# Patient Record
Sex: Female | Born: 1965 | Race: White | Hispanic: No | Marital: Married | Smoking: Never smoker
Health system: Southern US, Community
[De-identification: ages and names within clinical notes are randomized; demographics above are authoritative.]

## PROBLEM LIST (undated history)

## (undated) DIAGNOSIS — B019 Varicella without complication: Secondary | ICD-10-CM

## (undated) DIAGNOSIS — Z9189 Other specified personal risk factors, not elsewhere classified: Secondary | ICD-10-CM

## (undated) DIAGNOSIS — F329 Major depressive disorder, single episode, unspecified: Secondary | ICD-10-CM

## (undated) DIAGNOSIS — E039 Hypothyroidism, unspecified: Secondary | ICD-10-CM

## (undated) DIAGNOSIS — F32A Depression, unspecified: Secondary | ICD-10-CM

## (undated) HISTORY — DX: Varicella without complication: B01.9

## (undated) HISTORY — DX: Major depressive disorder, single episode, unspecified: F32.9

## (undated) HISTORY — DX: Other specified personal risk factors, not elsewhere classified: Z91.89

## (undated) HISTORY — DX: Depression, unspecified: F32.A

---

## 2004-11-26 HISTORY — PX: TUBAL LIGATION: SHX77

## 2014-10-02 ENCOUNTER — Encounter (HOSPITAL_COMMUNITY): Payer: Self-pay | Admitting: Nurse Practitioner

## 2014-10-02 ENCOUNTER — Emergency Department (HOSPITAL_COMMUNITY): Payer: 59

## 2014-10-02 ENCOUNTER — Emergency Department (HOSPITAL_COMMUNITY)
Admission: EM | Admit: 2014-10-02 | Discharge: 2014-10-02 | Disposition: A | Discharge status: Home / Self Care | Payer: 59 | Attending: Emergency Medicine | Admitting: Emergency Medicine

## 2014-10-02 DIAGNOSIS — R079 Chest pain, unspecified: Secondary | ICD-10-CM | POA: Insufficient documentation

## 2014-10-02 DIAGNOSIS — R42 Dizziness and giddiness: Secondary | ICD-10-CM | POA: Insufficient documentation

## 2014-10-02 DIAGNOSIS — R55 Syncope and collapse: Secondary | ICD-10-CM | POA: Insufficient documentation

## 2014-10-02 DIAGNOSIS — R112 Nausea with vomiting, unspecified: Secondary | ICD-10-CM | POA: Insufficient documentation

## 2014-10-02 DIAGNOSIS — Z8639 Personal history of other endocrine, nutritional and metabolic disease: Secondary | ICD-10-CM | POA: Insufficient documentation

## 2014-10-02 DIAGNOSIS — R0602 Shortness of breath: Secondary | ICD-10-CM | POA: Diagnosis not present

## 2014-10-02 HISTORY — DX: Hypothyroidism, unspecified: E03.9

## 2014-10-02 LAB — CBC
HEMATOCRIT: 35.6 % — AB (ref 36.0–46.0)
Hemoglobin: 12 g/dL (ref 12.0–15.0)
MCH: 30.3 pg (ref 26.0–34.0)
MCHC: 33.7 g/dL (ref 30.0–36.0)
MCV: 89.9 fL (ref 78.0–100.0)
PLATELETS: 395 10*3/uL (ref 150–400)
RBC: 3.96 MIL/uL (ref 3.87–5.11)
RDW: 12.5 % (ref 11.5–15.5)
WBC: 5.9 10*3/uL (ref 4.0–10.5)

## 2014-10-02 LAB — URINALYSIS, ROUTINE W REFLEX MICROSCOPIC
BILIRUBIN URINE: NEGATIVE
GLUCOSE, UA: NEGATIVE mg/dL
Ketones, ur: NEGATIVE mg/dL
Leukocytes, UA: NEGATIVE
Nitrite: NEGATIVE
PH: 6.5 (ref 5.0–8.0)
Protein, ur: NEGATIVE mg/dL
Specific Gravity, Urine: 1.009 (ref 1.005–1.030)
Urobilinogen, UA: 0.2 mg/dL (ref 0.0–1.0)

## 2014-10-02 LAB — BASIC METABOLIC PANEL
Anion gap: 11 (ref 5–15)
BUN: 15 mg/dL (ref 6–23)
CHLORIDE: 97 meq/L (ref 96–112)
CO2: 28 mEq/L (ref 19–32)
CREATININE: 0.9 mg/dL (ref 0.50–1.10)
Calcium: 9.6 mg/dL (ref 8.4–10.5)
GFR, EST AFRICAN AMERICAN: 87 mL/min — AB (ref >90)
GFR, EST NON AFRICAN AMERICAN: 75 mL/min — AB (ref >90)
Glucose, Bld: 100 mg/dL — ABNORMAL HIGH (ref 70–99)
POTASSIUM: 4.1 meq/L (ref 3.7–5.3)
Sodium: 136 mEq/L — ABNORMAL LOW (ref 137–147)

## 2014-10-02 LAB — URINE MICROSCOPIC-ADD ON

## 2014-10-02 LAB — TSH: TSH: 3.5 u[IU]/mL (ref 0.350–4.500)

## 2014-10-02 LAB — I-STAT TROPONIN, ED: Troponin i, poc: 0 ng/mL (ref 0.00–0.08)

## 2014-10-02 MED ORDER — MECLIZINE HCL 25 MG PO TABS
25.0000 mg | ORAL_TABLET | Freq: Once | ORAL | Status: AC
  Administered 2014-10-02: 25 mg via ORAL
  Filled 2014-10-02: qty 1

## 2014-10-02 MED ORDER — DIAZEPAM 5 MG PO TABS
5.0000 mg | ORAL_TABLET | Freq: Once | ORAL | Status: AC
  Administered 2014-10-02: 5 mg via ORAL
  Filled 2014-10-02: qty 1

## 2014-10-02 MED ORDER — MECLIZINE HCL 25 MG PO TABS: 25.0000 mg | ORAL_TABLET | Freq: Three times a day (TID) | ORAL | Status: DC | PRN

## 2014-10-02 MED ORDER — HYDROCODONE-ACETAMINOPHEN 5-325 MG PO TABS: 1.0000 | ORAL_TABLET | Freq: Four times a day (QID) | ORAL | Status: DC | PRN

## 2014-10-02 MED ORDER — IBUPROFEN 800 MG PO TABS: 800.0000 mg | ORAL_TABLET | Freq: Three times a day (TID) | ORAL | Status: AC

## 2014-10-02 MED ORDER — DIAZEPAM 5 MG PO TABS: 5.0000 mg | ORAL_TABLET | Freq: Three times a day (TID) | ORAL | Status: DC | PRN

## 2014-10-02 NOTE — Discharge Instructions (Signed)
Benign Positional Vertigo °Vertigo means you feel like you or your surroundings are moving when they are not. Benign positional vertigo is the most common form of vertigo. Benign means that the cause of your condition is not serious. Benign positional vertigo is more common in older adults. °CAUSES  °Benign positional vertigo is the result of an upset in the labyrinth system. This is an area in the middle ear that helps control your balance. This may be caused by a viral infection, head injury, or repetitive motion. However, often no specific cause is found. °SYMPTOMS  °Symptoms of benign positional vertigo occur when you move your head or eyes in different directions. Some of the symptoms may include: °· Loss of balance and falls. °· Vomiting. °· Blurred vision. °· Dizziness. °· Nausea. °· Involuntary eye movements (nystagmus). °DIAGNOSIS  °Benign positional vertigo is usually diagnosed by physical exam. If the specific cause of your benign positional vertigo is unknown, your caregiver may perform imaging tests, such as magnetic resonance imaging (MRI) or computed tomography (CT). °TREATMENT  °Your caregiver may recommend movements or procedures to correct the benign positional vertigo. Medicines such as meclizine, benzodiazepines, and medicines for nausea may be used to treat your symptoms. In rare cases, if your symptoms are caused by certain conditions that affect the inner ear, you may need surgery. °HOME CARE INSTRUCTIONS  °· Follow your caregiver's instructions. °· Move slowly. Do not make sudden body or head movements. °· Avoid driving. °· Avoid operating heavy machinery. °· Avoid performing any tasks that would be dangerous to you or others during a vertigo episode. °· Drink enough fluids to keep your urine clear or pale yellow. °SEEK IMMEDIATE MEDICAL CARE IF:  °· You develop problems with walking, weakness, numbness, or using your arms, hands, or legs. °· You have difficulty speaking. °· You develop  severe headaches. °· Your nausea or vomiting continues or gets worse. °· You develop visual changes. °· Your family or friends notice any behavioral changes. °· Your condition gets worse. °· You have a fever. °· You develop a stiff neck or sensitivity to light. °MAKE SURE YOU:  °· Understand these instructions. °· Will watch your condition. °· Will get help right away if you are not doing well or get worse. °Document Released: 08/20/2006 Document Revised: 02/04/2012 Document Reviewed: 08/02/2011 °ExitCare® Patient Information ©2015 ExitCare, LLC. This information is not intended to replace advice given to you by your health care provider. Make sure you discuss any questions you have with your health care provider. ° °Dizziness °Dizziness is a common problem. It is a feeling of unsteadiness or light-headedness. You may feel like you are about to faint. Dizziness can lead to injury if you stumble or fall. A person of any age group can suffer from dizziness, but dizziness is more common in older adults. °CAUSES  °Dizziness can be caused by many different things, including: °· Middle ear problems. °· Standing for too long. °· Infections. °· An allergic reaction. °· Aging. °· An emotional response to something, such as the sight of blood. °· Side effects of medicines. °· Tiredness. °· Problems with circulation or blood pressure. °· Excessive use of alcohol or medicines, or illegal drug use. °· Breathing too fast (hyperventilation). °· An irregular heart rhythm (arrhythmia). °· A low red blood cell count (anemia). °· Pregnancy. °· Vomiting, diarrhea, fever, or other illnesses that cause body fluid loss (dehydration). °· Diseases or conditions such as Parkinson's disease, high blood pressure (hypertension), diabetes, and thyroid problems. °·   Exposure to extreme heat. DIAGNOSIS  Your health care provider will ask about your symptoms, perform a physical exam, and perform an electrocardiogram (ECG) to record the electrical  activity of your heart. Your health care provider may also perform other heart or blood tests to determine the cause of your dizziness. These may include:  Transthoracic echocardiogram (TTE). During echocardiography, sound waves are used to evaluate how blood flows through your heart.  Transesophageal echocardiogram (TEE).  Cardiac monitoring. This allows your health care provider to monitor your heart rate and rhythm in real time.  Holter monitor. This is a portable device that records your heartbeat and can help diagnose heart arrhythmias. It allows your health care provider to track your heart activity for several days if needed.  Stress tests by exercise or by giving medicine that makes the heart beat faster. TREATMENT  Treatment of dizziness depends on the cause of your symptoms and can vary greatly. HOME CARE INSTRUCTIONS   Drink enough fluids to keep your urine clear or pale yellow. This is especially important in very hot weather. In older adults, it is also important in cold weather.  Take your medicine exactly as directed if your dizziness is caused by medicines. When taking blood pressure medicines, it is especially important to get up slowly.  Rise slowly from chairs and steady yourself until you feel okay.  In the morning, first sit up on the side of the bed. When you feel okay, stand slowly while holding onto something until you know your balance is fine.  Move your legs often if you need to stand in one place for a long time. Tighten and relax your muscles in your legs while standing.  Have someone stay with you for 1-2 days if dizziness continues to be a problem. Do this until you feel you are well enough to stay alone. Have the person call your health care provider if he or she notices changes in you that are concerning.  Do not drive or use heavy machinery if you feel dizzy.  Do not drink alcohol. SEEK IMMEDIATE MEDICAL CARE IF:   Your dizziness or light-headedness  gets worse.  You feel nauseous or vomit.  You have problems talking, walking, or using your arms, hands, or legs.  You feel weak.  You are not thinking clearly or you have trouble forming sentences. It may take a friend or family member to notice this.  You have chest pain, abdominal pain, shortness of breath, or sweating.  Your vision changes.  You notice any bleeding.  You have side effects from medicine that seems to be getting worse rather than better. MAKE SURE YOU:   Understand these instructions.  Will watch your condition.  Will get help right away if you are not doing well or get worse. Document Released: 05/08/2001 Document Revised: 11/17/2013 Document Reviewed: 06/01/2011 [facility] Patient Information 2015 [place], [place]. This information is not intended to replace advice given to you by your health care provider. Make sure you discuss any questions you have with your health care provider.   Emergency Department Resource Guide 1) Find a Doctor and Pay Out of Pocket Although you won't have to find out who is covered by your insurance plan, it is a good idea to ask around and get recommendations. You will then need to call the office and see if the doctor you have chosen will accept you as a new patient and what types of options they offer for patients who are self-pay. Some  doctors offer discounts or will set up payment plans for their patients who do not have insurance, but you will need to ask so you aren't surprised when you get to your appointment.  2) Contact Your Local Health Department Not all health departments have doctors that can see patients for sick visits, but many do, so it is worth a call to see if yours does. If you don't know where your local health department is, you can check in your phone book. The CDC also has a tool to help you locate your state's health department, and many state websites also have listings of all of their local health  departments.  3) Find a Walk-in Clinic If your illness is not likely to be very severe or complicated, you may want to try a walk in clinic. These are popping up all over the country in pharmacies, drugstores, and shopping centers. They're usually staffed by nurse practitioners or physician assistants that have been trained to treat common illnesses and complaints. They're usually fairly quick and inexpensive. However, if you have serious medical issues or chronic medical problems, these are probably not your best option.  No Primary Care Doctor: - Call Health Connect at  [phone] - they can help you locate a primary care doctor that  accepts your insurance, provides certain services, etc. - Physician Referral Service- [phone]  Chronic Pain Problems: Organization         Address  Phone   Notes  [facility]  [phone] Patients need to be referred by their primary care doctor.   Medication Assistance: Organization         Address  Phone   Notes  [facility] [address] [place], [place] [address] [phone] --Must be a resident of [place] -- Must have NO insurance coverage whatsoever (no Medicaid/ Medicare, etc.) -- The pt. MUST have a primary care doctor that directs their care regularly and follows them in the community   Med[facility]ist  [phone]) [phone]   [employer]  [phone]) [phone]    Agencies that provide inexpensive medical care: Organization         Address  Phone   Notes  [facility]  [phone]) [phone]   [facility]    [phone]) [phone]   [facility] [address] [place], [place] [address] [phone]) [phone]   [facility] [address], [place] [phone]   [facility]anned Parenthood    [phone]) [phone]   [facility]    [phone]   [facility]  [address], [place] Phone:  [phone])  [phone], Fax:  [phone] Hours of Operation:  9 am - 6 pm, M-F.  Also accepts Medicaid/Medicare and self-pay.  [facility]  [address], [place] Phone: [phone], Fax: [phone]) [phone]. Hours of Operation:  8:30 am - 5:30 pm, M-F.  Also accepts Medicaid and self-pay.  [facility] [place] [address], [place] Phone: [phone]) [phone]   [facility] [address], [place], [place] [phone], Ext. [phone] Mondays & Thursdays: 7-9 AM.  First 15 patients are seen on a first come, first serve basis.    Medicaid-accepting [facility] Providers:  Organization         Address  Phone   Notes  [facility] [address], [place] [phone] Also accepts self-pay patients.  [facility] [id number] [place], [place]  [phone]   [place], [address], [place] [phone]   [facility] [address], [place] [phone]   Lucianne Lei [address], [place]   [phone] Only accepts [place] Access [place] patients after they have their name applied to their card.   Self-Pay (no insurance) in [facility]:  Organization         Address  Phone   Notes  Sickle Cell Patients, [facility] [employer] [phone]   [facility] Urgent Care [place] [phone]   [facility]  [place], [place], [place] [phone]   [facility]/Dr. Osei-Bonsu  [address] [place] or [address], [place] [phone] Phone number for both [place] and [place] locations is the same.  [facility] [address], [place] [phone]   [facility] [address] [place] or [address] [phone]   [facility] [address] [place] [phone], phone; [phone], fax Sees patients 1st and 3rd Saturday of every month.  Must not qualify for public or private insurance (i.e. Medicaid, Medicare, [place] Health Choice, Veterans' Benefits)  Household income should be no more than 200% of the poverty level The clinic cannot treat you if you are pregnant or think you are pregnant  Sexually transmitted diseases are not treated at the clinic.    Dental Care: Organization         Address  Phone  Notes  [facility] Department of [facility] [place] [phone] Accepts children up to age 58 who are enrolled in [place] or [place]; pregnant women with a Medicaid card; and children who have applied for Medicaid or [place] Health Choice, but were declined, whose parents can pay a reduced fee at time of service.  [facility] of [facility]  [address], [place] [phone] Accepts children up to age 82 who are enrolled in [place] or [place]; pregnant women with a Medicaid card; and children who have applied for Medicaid or [place] Health Choice, but were declined, whose parents can pay a reduced fee at time of service.  [facility] Access PRO[facility]  [place] [phone] Patients are seen by appointment only. Walk-ins are not accepted. [place] will see patients 84 years of age and older. Monday - Tuesday (8am-5pm) Most Wednesdays (8:30-5pm) $30 per visit, cash only  [facility] Adult Dental Access PROGRAM  [address], [facility] [phone] Patients are seen by appointment only. Walk-ins are not accepted. [place] will see patients 36 years of age and older. One Wednesday Evening (Monthly: Volunteer Based).  $30 per visit, cash only  [place]  [phone] for adults;  Children under age 18, call Graduate Pediatric Dentistry at [phone]. Children aged 53-14, please call [phone] to request a pediatric application.  Dental services are provided in all areas of dental care including fillings, crowns and bridges, complete and partial dentures, implants, gum treatment, root canals, and extractions. Preventive  care is also provided. Treatment is provided to both adults and children. Patients are selected via a lottery and there is often a waiting list.   [facility] [address] [place]  [phone] [email or web address]   [facility] [address] [place], [place] [phone], Ext. [phone] Second and Fourth Thursday of each month, opens at 6:30 AM; Clinic ends at 9 AM.  Patients are seen on a first-come first-served basis, and a limited number are seen during each clinic.   [facility]  [address] Hillard Danker [place], [place] [phone]   Eligibility Requirements You must have lived in [place], [place], or [place] counties for at least the last three months.   You cannot be eligible for state or federal sponsored [employer], including [employer], [place], or [employer].   You generally cannot be eligible for healthcare insurance through your employer.    How to apply: Eligibility screenings are held every Tuesday and Wednesday afternoon from 1:00 pm until 4:00 pm. You do not need an appointment for the interview!  [facility] [address] [place], [place]   [place]  [place] Department  [place]  [phone]    Behavioral Health Resources in the Community: Intensive Outpatient Programs Organization         Address  Phone  Notes  [facility] [place]. [address] [place], [place] [phone]   [facility]patient [address] [place], [place]   ADS: Alcohol [facility] Drug S[facility] [address] [place], [place]   [place] [address]  [place], [place] or [phone]   Substance Abuse Resources Organization         Address  Phone  Notes  Alcohol and Drug Services  7183960288   [place]  [phone]   [facility] [place]   Chinita Pester  [phone]   Residential & Outpatient Substance Abuse Program  [phone]   Psychological Services Organization         Address  Phone  Notes  [facility] [place]  [place]  [phone]   [place] [address] [place] or [phone]    Mobile Crisis Teams Organization         Address  Phone  Notes  [facility], Mobile Crisis Care Unit  [phone]   [facility]  [address] [place], [place]   [place] Levels [address] [place] [phone]    Self-Help/Support Groups Organization         Address  Phone             Notes  [place]. of [place] - variety of support groups  [place] Call for more information  Narcotics Anonymous (NA), [facility] [address], [employer] [place]  2 meetings at this location   Special educational needs teacher         Address  Phone  Notes  [facility] Residential Treatment [place],    [place]  [phone]   [facility]  [address] [place] [id number], [place], [place]   [place], [place] [phone] Admissions: 8am-3pm M-F  Incentives Substance [place] [address]  [place], [place]   [facility] [address] Jadene Pierini [place], [place]   The [place].,  [place], [place]   [facility] Programs - Intensive  Outpatient [address] Kristeen Mans [address], [place], [place]   [facility] ([place] [address]  [place], [place] [phone] or [phone]   [facility] Services (RTS) [address] [place], [place] Accepts Medicaid  [facility] [place] [address]  [place] [phone] Substance Abuse/Addiction Treatment   [facility] Organization         Address  Phone  Notes  [facility]  [phone]   Domenic Schwab, PhD [address] [place], [place]   [phone] or [phone]   [place], [place] [phone]   [facility] [address] [place], [place] [phone] Insurance/Medicaid/sponsorship through [facility] and Families [address] [place]                                    [place], [place] [phone] [place] [address], [place] [phone]    Dr. Adele Schilder  [phone]   [facility] Dept. 1) [address] [place] 2) [place] 3)  [address], [place] [phone]  [phone]   [place] [phone] or [phone] (After Hours)

## 2014-10-02 NOTE — ED Notes (Signed)
Pt states dizziness x3 months, was recently diagnosed with hypothyroidism and has been taking medications. Pt also states nausea/vomiting and generalized body aches. No neurological deficits noted. Able to move all extremities. Pt is alert and oriented x4. NAD.

## 2014-10-02 NOTE — ED Provider Notes (Signed)
CSN: [id number]     Arrival date & time 10/02/14  1407 History   First MD Initiated Contact with Patient 10/02/14 1458     Chief Complaint  Patient presents with  . Dizziness     (Consider location/radiation/quality/duration/timing/severity/associated sxs/prior Treatment) Patient is a 48 y.o. female presenting with dizziness. The history is provided by the patient.  Dizziness Quality:  Head spinning, lightheadedness and vertigo Severity:  Moderate Onset quality:  Gradual Timing:  Constant Progression:  Worsening Chronicity:  New Context: physical activity and standing up   Relieved by:  Being still Worsened by:  Nothing tried Associated symptoms: chest pain (sharp, central, nonradiating, with her dizzy spells, never by itself), nausea, shortness of breath (with her dizzy spells) and vomiting     Past Medical History  Diagnosis Date  . Hypothyroid    History reviewed. No pertinent past surgical history. History reviewed. No pertinent family history. History  Substance Use Topics  . Smoking status: Never Smoker   . Smokeless tobacco: Not on file  . Alcohol Use: Yes     Comment: social   OB History    No data available     Review of Systems  Constitutional: Negative for fever and chills.  Respiratory: Positive for shortness of breath (with her dizzy spells).   Cardiovascular: Positive for chest pain (sharp, central, nonradiating, with her dizzy spells, never by itself).  Gastrointestinal: Positive for nausea and vomiting.  Musculoskeletal: Positive for joint swelling (hip pains recently).  Neurological: Positive for dizziness and syncope (2 months ago).  All other systems reviewed and are negative.     Allergies  Review of patient's allergies indicates not on file.  Home Medications   Prior to Admission medications   Not on File   BP 162/97 mmHg  Pulse 57  Temp(Src) 97.3 F (36.3 C) (Oral)  Resp 20  Ht 5\' 7"  (1.702 m)  Wt 176 lb 9.6 oz (80.105 kg)  BMI  27.65 kg/m2  SpO2 100% Physical Exam  Constitutional: She is oriented to person, place, and time. She appears well-developed and well-nourished. No distress.  HENT:  Head: Normocephalic and atraumatic.  Mouth/Throat: Oropharynx is clear and moist.  Eyes: EOM are normal. Pupils are equal, round, and reactive to light.  Neck: Normal range of motion. Neck supple.  Cardiovascular: Normal rate and regular rhythm.  Exam reveals no friction rub.   No murmur heard. Pulmonary/Chest: Effort normal and breath sounds normal. No respiratory distress. She has no wheezes. She has no rales.  Abdominal: Soft. She exhibits no distension. There is no tenderness. There is no rebound.  Musculoskeletal: Normal range of motion. She exhibits no edema.  Neurological: She is alert and oriented to person, place, and time. No cranial nerve deficit. She exhibits normal muscle tone. Coordination normal.  No ataxia, however extremely dizzy when standing  Skin: No rash noted. She is not diaphoretic.  Nursing note and vitals reviewed.   ED Course  Procedures (including critical care time) Labs Review Labs Reviewed  CBC - Abnormal; Notable for the following:    HCT 35.6 (*)    All other components within normal limits  BASIC METABOLIC PANEL - Abnormal; Notable for the following:    Sodium 136 (*)    Glucose, Bld 100 (*)    GFR calc non Af Amer 75 (*)    GFR calc Af Amer 87 (*)    All other components within normal limits  URINALYSIS, ROUTINE W REFLEX MICROSCOPIC  I-STAT TROPOININ, ED  Imaging Review Dg Chest 2 View  10/02/2014   CLINICAL DATA:  Dizziness for last 2 months since being diagnosed with hypothyroidism.  EXAM: CHEST  2 VIEW  COMPARISON:  None.  FINDINGS: The heart size and mediastinal contours are within normal limits. Both lungs are clear. No pleural effusion or pneumothorax. The visualized skeletal structures are unremarkable.  IMPRESSION: Normal chest radiographs.   Electronically Signed   By:  Amie Portlandavid  Ormond M.D.   On: 10/02/2014 17:01   Dg Pelvis 1-2 Views  10/02/2014   CLINICAL DATA:  Severe bilateral hip pain for 2 months  EXAM: PELVIS - 1-2 VIEW  COMPARISON:  None.  FINDINGS: There is no evidence of pelvic fracture or diastasis. No pelvic bone lesions are seen.  IMPRESSION: Negative.   Electronically Signed   By: Christiana PellantGretchen  Green M.D.   On: 10/02/2014 17:01   Mr Brain Wo Contrast  10/02/2014   CLINICAL DATA:  Dizziness. Hypothyroidism. Nausea and vomiting. Generalized body aches.  EXAM: MRI HEAD WITHOUT CONTRAST  TECHNIQUE: Multiplanar, multiecho pulse sequences of the brain and surrounding structures were obtained without intravenous contrast.  COMPARISON:  None.  FINDINGS: No acute infarct, hemorrhage, or mass lesion is present. The ventricles are of normal size. No significant extraaxial fluid collection is present. No significant white matter disease is present.  Flow is present in the major intracranial arteries. The globes and orbits are intact. Midline structures are within normal limits. Mild mucosal thickening is present in the anterior ethmoid air cells and inferior right frontal sinus. Remaining paranasal sinuses and the mastoid air cells are clear.  IMPRESSION: 1. Normal MRI appearance of the brain. 2. Minimal anterior paranasal sinus disease.   Electronically Signed   By: Gennette Pachris  Mattern M.D.   On: 10/02/2014 16:48     EKG Interpretation   Date/Time:  Saturday October 02 2014 14:15:06 EST Ventricular Rate:  60 PR Interval:  166 QRS Duration: 94 QT Interval:  420 QTC Calculation: 420 R Axis:   19 Text Interpretation:  Normal sinus rhythm Low voltage QRS Cannot rule out  Anterior infarct , age undetermined Abnormal ECG Similar to prior  Confirmed by Gwendolyn GrantWALDEN  MD, Haziel Molner [phone]) on 10/02/2014 2:59:33 PM      MDM   Final diagnoses:  Dizziness  Vertigo    48 year old female presents with multiple complaints. She has been complaining of dizziness for 3 months. These  episodes of dizziness are described as "vertigo, lightheaded, and dizzy". She also has sharp central chest pain with these episodes and usually has nausea. She's had these for 2 months now. She passed on the shower 31 day 2 months ago and saw physician in [place], where she used to reside as of one year ago. She was diagnosed with hypothyroidism and started on levothyroxine. Patient has noticed increasing episodes of the dizziness, chest pain, nausea over the past 3 months and the dizziness has been constant for the past 3 days. Denies any fever, blurry vision, shortness of breath. She has no medical history and no family medical history. Here vitals are stable. Heart and lungs are clear. Belly is benign. Lasted the patient up she became dizzy. She is not ataxic, but cannot move because of dizziness became severe. TMs are clear. With her multiple descriptors of dizziness, hard to pinpoint etiology. EKG is normal. Will check orthostatics, basic labs, recent peak thyroid studies. Will get an MRI of her brain.   MRI, x-rays, labs normal. Feeling better with medicines, but not fully 100%.  Orthostatics negative. Informed her this could be vertigo but will need further workup. Given meclizine, Valium to go home for that. She is also complaining of right hip pain, could be bursitis as she's had no trauma and has normal x-rays. Given Motrin and mild amount of pain medicine. Given resource guide help establish follow-up. Stable for discharge.  Elwin MochaBlair Laura-Lee Villegas, MD 10/02/14 [phone]

## 2014-10-02 NOTE — ED Notes (Signed)
Pt went to [facility] today for 3 month history of intermittent dizziness. She was sent here for further evaluation. She was seen by her PCP in [place] and diagnosed with hypothyroidism on 10/19. She was started on medication treatment of hypothyroid at that time and initially she states she "felt a little better" but has started to feel worse again since. She reports she "Feels bad all the time, nauseated and my hips hurt."

## 2014-10-05 ENCOUNTER — Encounter: Payer: Self-pay | Admitting: Internal Medicine

## 2014-10-05 ENCOUNTER — Ambulatory Visit: Payer: PRIVATE HEALTH INSURANCE | Admitting: Internal Medicine

## 2014-10-05 ENCOUNTER — Ambulatory Visit (INDEPENDENT_AMBULATORY_CARE_PROVIDER_SITE_OTHER): Payer: 59 | Admitting: Internal Medicine

## 2014-10-05 VITALS — BP 104/62 | HR 63 | Temp 98.3°F | Ht 67.0 in | Wt 172.0 lb

## 2014-10-05 DIAGNOSIS — R42 Dizziness and giddiness: Secondary | ICD-10-CM | POA: Diagnosis not present

## 2014-10-05 DIAGNOSIS — M7070 Other bursitis of hip, unspecified hip: Secondary | ICD-10-CM | POA: Diagnosis not present

## 2014-10-05 MED ORDER — PREDNISONE 10 MG PO TABS: ORAL_TABLET | ORAL | Status: DC

## 2014-10-05 NOTE — Progress Notes (Signed)
Subjective:    Patient ID: Brandi Tate, female    DOB: 05-02-1966, 48 y.o.   MRN: [id number]  HPI  Pt presents to the clinic today for ER followup. She is a new patient that will be establishing care with me 11/22/14. She went to the ER 10/02/14 with multiple complaints.  Dizzy: Started 3 months ago. Worse with position changes and standing. Described it as the room spinning. She did have some associated chest pain that only occurred with the dizziness. Denies syncopal episodes. Denies nausea or vomiting. MRI of brain was normal. Chest xray and ECG were normal. Orthostatics were negative. She was given Meclizine and Valium and discharge home. She is taking the meclizine once daily. Her dizziness has improved tremendously. She is not taking the Valium. She has not had any recurrent chest pain.  Bilateral Hip Pain: Started 2 months, no prior injury. Xray of hips was normal. ? Bursitis. She was given Motrin and Narcotic pain medication. She reports the Norco has helped a little with the pain. She is only taking it once daily. She is concerned because she has not received as much relief from the hip pain as she had hoped.  Review of Systems      Past Medical History  Diagnosis Date  . Hypothyroid     Current Outpatient Prescriptions  Medication Sig Dispense Refill  . diazepam (VALIUM) 5 MG tablet Take 1 tablet (5 mg total) by mouth every 8 (eight) hours as needed (dizziness). 30 tablet 0  . HYDROcodone-acetaminophen (NORCO/VICODIN) 5-325 MG per tablet Take 1 tablet by mouth every 6 (six) hours as needed for moderate pain. 10 tablet 0  . ibuprofen (ADVIL,MOTRIN) 800 MG tablet Take 1 tablet (800 mg total) by mouth 3 (three) times daily. 21 tablet 0  . levothyroxine (SYNTHROID, LEVOTHROID) 25 MCG tablet Take 25 mcg by mouth daily before breakfast.    . meclizine (ANTIVERT) 25 MG tablet Take 1 tablet (25 mg total) by mouth 3 (three) times daily as needed for dizziness. 30 tablet 0   No  current facility-administered medications for this visit.    No Known Allergies  No family history on file.  History   Social History  . Marital Status: Married    Spouse Name: N/A    Number of Children: N/A  . Years of Education: N/A   Occupational History  . Not on file.   Social History Main Topics  . Smoking status: Never Smoker   . Smokeless tobacco: Not on file  . Alcohol Use: Yes     Comment: social  . Drug Use: No  . Sexual Activity: Not on file   Other Topics Concern  . Not on file   Social History Narrative     Constitutional: Denies fever, malaise, fatigue, headache or abrupt weight changes.  Respiratory: Denies difficulty breathing, shortness of breath, cough or sputum production.   Cardiovascular: Denies chest pain, chest tightness, palpitations or swelling in the hands or feet.  Musculoskeletal: Pt reports bilateral hip pain. Denies difficulty with gait, muscle pain or joint swelling.  Neurological: Denies dizziness, difficulty with memory, difficulty with speech or problems with balance and coordination.   No other specific complaints in a complete review of systems (except as listed in HPI above).  Objective:   Physical Exam  BP 104/62 mmHg  Pulse 63  Temp(Src) 98.3 F (36.8 C) (Oral)  Ht 5\' 7"  (1.702 m)  Wt 172 lb (78.019 kg)  BMI 26.93 kg/m2  SpO2 98%  LMP 09/25/2014 Wt Readings from Last 3 Encounters:  10/05/14 172 lb (78.019 kg)  10/02/14 176 lb 9.6 oz (80.105 kg)    General: Appears her stated age, well developed, well nourished in NAD. Cardiovascular: Normal rate and rhythm. S1,S2 noted.  No murmur, rubs or gallops noted.  Pulmonary/Chest: Normal effort and positive vesicular breath sounds. No respiratory distress. No wheezes, rales or ronchi noted.  Musculoskeletal: Normal internal and external rotation of bilateral hips. No pain with palpation of the lumbar spine. Strength 5/5 BLE. Pain with palpation of bilateral trochanter. No  difficulty with gait.   BMET    Component Value Date/Time   NA 136* 10/02/2014 1429   K 4.1 10/02/2014 1429   CL 97 10/02/2014 1429   CO2 28 10/02/2014 1429   GLUCOSE 100* 10/02/2014 1429   BUN 15 10/02/2014 1429   CREATININE 0.90 10/02/2014 1429   CALCIUM 9.6 10/02/2014 1429   GFRNONAA 75* 10/02/2014 1429   GFRAA 87* 10/02/2014 1429    Lipid Panel  No results found for: CHOL, TRIG, HDL, CHOLHDL, VLDL, LDLCALC  CBC    Component Value Date/Time   WBC 5.9 10/02/2014 1429   RBC 3.96 10/02/2014 1429   HGB 12.0 10/02/2014 1429   HCT 35.6* 10/02/2014 1429   PLT 395 10/02/2014 1429   MCV 89.9 10/02/2014 1429   MCH 30.3 10/02/2014 1429   MCHC 33.7 10/02/2014 1429   RDW 12.5 10/02/2014 1429    Hgb A1C No results found for: HGBA1C       Assessment & Plan:   Hospital follow up for Vertigo and Bilateral Hip Pain:  Hospital notes, labs, imaging reviewed  Vertigo improved with meclizine She is not taking the Valium- will d/c Take the meclizine for 2 more days, if dizziness returns, ok to restart Drink plenty of fluids, as dehydration can make dizziness worse  Bilateral hip pain concerning for bursitis Stop Norco- will d/c eRx for pred taper If pain persist, consider steroid injection directly into the bursa- will refer to Dr. Patsy Lageropland for this if needed  RTC in 1 month for your new patient.

## 2014-10-05 NOTE — Progress Notes (Signed)
Pre visit review using our clinic review tool, if applicable. No additional management support is needed unless otherwise documented below in the visit note. 

## 2014-10-05 NOTE — Patient Instructions (Signed)
Bursitis Bursitis is inflammation of a bursa. A bursa is a soft, fluid-filled sac. It cushions the soft tissue around a bone. Bursitis often occurs in the bursas near the shoulders, elbows, knees, pelvis, hips, heel, and Achilles tendon.  SYMPTOMS   Pain and tenderness in the affected area. Sometimes, pain radiates into surrounding areas. Specifically, pain with movement.  Limited range of motion of the affected joint.  Sometimes, painless swelling of the bursa.  Fever (when infected). CAUSES   Injury to a joint or bursa.  Overuse or strenuous exercise of a joint.  Gout (disease with inflamed joints).  Prolonged pressure on a joint containing bursas (resting on an elbow or kneeling).  Arthritis.  Acute or chronic infection.  Calcium deposits in shoulder tendons, with degeneration of the tendon. RISK INCREASES WITH:  Vigorous, repeated, or sudden increase in athletic training or activity level.  Failure to warm up properly.  Overstretching.  Improper exercise technique.  Playing sports on AstroTurf. PREVENTION  Avoid injuries or overuse of muscles.  Warm up and cool down properly. Do this before and after physical activity.  Maintain proper conditioning:  Joint flexibility.  Muscle strength and endurance.  Cardiovascular fitness.  Learn and use proper technique.  Wear protective equipment. PROGNOSIS  With proper treatment, symptoms often go away within 7 to 14 days.  RELATED COMPLICATIONS   Frequent recurrence of symptoms. This can result in a chronic, repetitive problem.  Joint stiffness.  Limited joint movement.  Infection of bursa.  Chronic inflammation or scarring of bursa. TREATMENT Treatment first involves protecting and resting the bursa and its joint. You may use ice or an elastic bandage to reduce inflammation. Anti-inflammatory medicines may help resolve the swelling. If symptoms persist despite treatment, a caregiver may withdraw fluid  from the bursa. They might also consider a corticosteroid injection. Sometimes, bursitis will persist in spite of nonsurgical treatment or will become infected. These cases may require removal (surgical excision) of the bursa.  MEDICATION   If pain medicine is needed, nonsteroidal anti-inflammatory medicines, such as aspirin and ibuprofen, or other minor pain relievers, such as acetaminophen, are often recommended.  Do not take pain medicine for 7 days before surgery.  Prescription pain relievers are usually only prescribed after surgery. Use only as directed and only as much as you need.  Ointments applied to the skin may be helpful.  Corticosteroid injections may be given. This is done to reduce inflammation in the bursa. HEAT AND COLD:  Cold treatment (icing) relieves pain and reduces inflammation. Cold treatment should be applied for 10 to 15 minutes every 2 to 3 hours for inflammation and pain, and immediately after any activity that aggravates your symptoms. Use ice packs or an ice massage.  Heat treatment may be used prior to performing the stretching and strengthening activities prescribed by your caregiver, physical therapist, or athletic trainer. Use a heat pack or a warm soak. SEEK MEDICAL CARE IF:   Symptoms get worse or do not improve in 2 weeks, despite treatment.  New, unexplained symptoms develop. (Drugs used in treatment may produce side effects.) Document Released: 11/12/2005 Document Revised: 03/29/2014 Document Reviewed: 02/24/2009 Exit[employer] Patient Information 2015 [employer]. This information is not intended to replace advice given to you by your health care provider. Make sure you discuss any questions you have with your health care provider.  

## 2014-10-26 ENCOUNTER — Other Ambulatory Visit: Payer: Self-pay

## 2014-10-26 MED ORDER — MECLIZINE HCL 25 MG PO TABS: 25.0000 mg | ORAL_TABLET | Freq: Three times a day (TID) | ORAL | Status: DC | PRN

## 2014-10-26 NOTE — Telephone Encounter (Signed)
Sent in the antiver- I saw her for dizziness I have not addressed her anxiety or her hypothyroidism so I will not be able to refill those until her new pt appt

## 2014-10-26 NOTE — Telephone Encounter (Signed)
Pt was seen on 10/05/14 and has new pt to establish appt with Nicki Reaperegina Baity NP on 11/22/14. Pt request refills on levothyroxine,diazepam and meclizine which Nicki Reaperegina Baity NP has never prescribed for pt before. [facility].Please advise.

## 2014-10-27 NOTE — Telephone Encounter (Signed)
Left message on voicemail.

## 2014-10-27 NOTE — Telephone Encounter (Signed)
Pt is aware as instructed and expressed understanding 

## 2014-11-22 ENCOUNTER — Ambulatory Visit: Payer: PRIVATE HEALTH INSURANCE | Admitting: Internal Medicine

## 2014-12-14 ENCOUNTER — Ambulatory Visit (INDEPENDENT_AMBULATORY_CARE_PROVIDER_SITE_OTHER): Payer: 59 | Admitting: Internal Medicine

## 2014-12-14 ENCOUNTER — Encounter: Payer: Self-pay | Admitting: Internal Medicine

## 2014-12-14 VITALS — BP 120/84 | HR 59 | Temp 98.0°F | Wt 178.5 lb

## 2014-12-14 DIAGNOSIS — R42 Dizziness and giddiness: Secondary | ICD-10-CM

## 2014-12-14 DIAGNOSIS — E039 Hypothyroidism, unspecified: Secondary | ICD-10-CM

## 2014-12-14 DIAGNOSIS — F411 Generalized anxiety disorder: Secondary | ICD-10-CM

## 2014-12-14 LAB — T4, FREE: FREE T4: 0.52 ng/dL — AB (ref 0.60–1.60)

## 2014-12-14 LAB — TSH: TSH: 3.08 u[IU]/mL (ref 0.35–4.50)

## 2014-12-14 MED ORDER — SERTRALINE HCL 50 MG PO TABS: 50.0000 mg | ORAL_TABLET | Freq: Every day | ORAL | Status: DC

## 2014-12-14 MED ORDER — ALPRAZOLAM 0.5 MG PO TABS: 0.5000 mg | ORAL_TABLET | Freq: Two times a day (BID) | ORAL | Status: DC | PRN

## 2014-12-14 NOTE — Progress Notes (Signed)
Pre visit review using our clinic review tool, if applicable. No additional management support is needed unless otherwise documented below in the visit note. 

## 2014-12-14 NOTE — Progress Notes (Signed)
Subjective:    Patient ID: Brandi Tate, female    DOB: 02/09/1966, 49 y.o.   MRN: [id number]  HPI  Pt presents to the clinic today with c/o reoccurring Vertigo. She reports this reoccurred  about 2 weeks but has gotten worse over the last week. She reports the room feels like it is spinning. She does have some associated nausea but denies vomiting, chest pain or shortness of breath. She did fall due to the dizziness yesterday. She reports she started walking up the stairs, she became dizzy, everything went blank and she fell backwards. She denies LOC. She did not hit her head. She reports the Meclizine is not helping. She also just realized that she has not taken her synthroid in the last 4 weeks. She reports extreme stress lately but denies anxiety or depression.  Review of Systems      Past Medical History  Diagnosis Date  . Hypothyroid     Current Outpatient Prescriptions  Medication Sig Dispense Refill  . ibuprofen (ADVIL,MOTRIN) 800 MG tablet Take 1 tablet (800 mg total) by mouth 3 (three) times daily. 21 tablet 0  . levothyroxine (SYNTHROID, LEVOTHROID) 25 MCG tablet Take 25 mcg by mouth daily before breakfast.    . meclizine (ANTIVERT) 25 MG tablet Take 1 tablet (25 mg total) by mouth 3 (three) times daily as needed for dizziness. 30 tablet 0   No current facility-administered medications for this visit.    No Known Allergies  History reviewed. No pertinent family history.  History   Social History  . Marital Status: Married    Spouse Name: N/A    Number of Children: N/A  . Years of Education: N/A   Occupational History  . Not on file.   Social History Main Topics  . Smoking status: Never Smoker   . Smokeless tobacco: Not on file  . Alcohol Use: 0.0 oz/week    0 Not specified per week     Comment: social  . Drug Use: No  . Sexual Activity: Not on file   Other Topics Concern  . Not on file   Social History Narrative     Constitutional: Denies fever,  malaise, fatigue, headache or abrupt weight changes.  HEENT: Denies eye pain, eye redness, ear pain, ringing in the ears, wax buildup, runny nose, nasal congestion, bloody nose, or sore throat. Respiratory: Denies difficulty breathing, shortness of breath, cough or sputum production.   Cardiovascular: Denies chest pain, chest tightness, palpitations or swelling in the hands or feet.  Neurological: Pt reports dizziness. Denies difficulty with memory, difficulty with speech or problems with balance and coordination.  Psych: Pt reports stress. Denies anxiety, depression, SI/HI.  No other specific complaints in a complete review of systems (except as listed in HPI above).  Objective:   Physical Exam   BP 120/84 mmHg  Pulse 59  Temp(Src) 98 F (36.7 C) (Oral)  Wt 178 lb 8 oz (80.967 kg)  SpO2 99% Wt Readings from Last 3 Encounters:  12/14/14 178 lb 8 oz (80.967 kg)  10/05/14 172 lb (78.019 kg)  10/02/14 176 lb 9.6 oz (80.105 kg)    General: Appears her stated age, well developed, well nourished in NAD. HEENT: Head: normal shape and size; Eyes: sclera white, no icterus, conjunctiva pink; Ears: Tm's gray and intact, normal light reflex; Nose: mucosa pink and moist, septum midline; Throat/Mouth: Teeth present, mucosa pink and moist, no exudate, lesions or ulcerations noted.  Cardiovascular: Normal rate and rhythm. S1,S2 noted.  No murmur, rubs or gallops noted. No carotid bruits noted. Pulmonary/Chest: Normal effort and positive vesicular breath sounds. No respiratory distress. No wheezes, rales or ronchi noted.  Neurological: Alert and oriented. Coordination normal.  Psychiatric: Mood anxious appearing but affect normal. Behavior is normal. Judgment and thought content normal.    BMET    Component Value Date/Time   NA 136* 10/02/2014 1429   K 4.1 10/02/2014 1429   CL 97 10/02/2014 1429   CO2 28 10/02/2014 1429   GLUCOSE 100* 10/02/2014 1429   BUN 15 10/02/2014 1429   CREATININE  0.90 10/02/2014 1429   CALCIUM 9.6 10/02/2014 1429   GFRNONAA 75* 10/02/2014 1429   GFRAA 87* 10/02/2014 1429    Lipid Panel  No results found for: CHOL, TRIG, HDL, CHOLHDL, VLDL, LDLCALC  CBC    Component Value Date/Time   WBC 5.9 10/02/2014 1429   RBC 3.96 10/02/2014 1429   HGB 12.0 10/02/2014 1429   HCT 35.6* 10/02/2014 1429   PLT 395 10/02/2014 1429   MCV 89.9 10/02/2014 1429   MCH 30.3 10/02/2014 1429   MCHC 33.7 10/02/2014 1429   RDW 12.5 10/02/2014 1429    Hgb A1C No results found for: HGBA1C      Assessment & Plan:   Vertigo:  She has had a very extensive workup in the ER ? If her thyroid levels are off due to her not taking her medication for the last 4 weeks Will check TSH and free T4 today ? If this is anxiety related Will start Zoloft 50 mg daily- advised her to give this 4 weeks to see if she notices any improvement Will give RX for short term xanax 0.5 mg in the interim- discussed the addictive properties of this medication- she understands and agrees  If continues to have vertigo, will refer to specialist

## 2014-12-14 NOTE — Patient Instructions (Signed)
Generalized Anxiety Disorder Generalized anxiety disorder (GAD) is a mental disorder. It interferes with life functions, including relationships, work, and school. GAD is different from normal anxiety, which everyone experiences at some point in their lives in response to specific life events and activities. Normal anxiety actually helps us prepare for and get through these life events and activities. Normal anxiety goes away after the event or activity is over.  GAD causes anxiety that is not necessarily related to specific events or activities. It also causes excess anxiety in proportion to specific events or activities. The anxiety associated with GAD is also difficult to control. GAD can vary from mild to severe. People with severe GAD can have intense waves of anxiety with physical symptoms (panic attacks).  SYMPTOMS The anxiety and worry associated with GAD are difficult to control. This anxiety and worry are related to many life events and activities and also occur more days than not for 6 months or longer. People with GAD also have three or more of the following symptoms (one or more in children):  Restlessness.   Fatigue.  Difficulty concentrating.   Irritability.  Muscle tension.  Difficulty sleeping or unsatisfying sleep. DIAGNOSIS GAD is diagnosed through an assessment by your health care provider. Your health care provider will ask you questions aboutyour mood,physical symptoms, and events in your life. Your health care provider may ask you about your medical history and use of alcohol or drugs, including prescription medicines. Your health care provider may also do a physical exam and blood tests. Certain medical conditions and the use of certain substances can cause symptoms similar to those associated with GAD. Your health care provider may refer you to a mental health specialist for further evaluation. TREATMENT The following therapies are usually used to treat GAD:    Medication. Antidepressant medication usually is prescribed for long-term daily control. Antianxiety medicines may be added in severe cases, especially when panic attacks occur.   Talk therapy (psychotherapy). Certain types of talk therapy can be helpful in treating GAD by providing support, education, and guidance. A form of talk therapy called cognitive behavioral therapy can teach you healthy ways to think about and react to daily life events and activities.  Stress managementtechniques. These include yoga, meditation, and exercise and can be very helpful when they are practiced regularly. A mental health specialist can help determine which treatment is best for you. Some people see improvement with one therapy. However, other people require a combination of therapies. Document Released: 03/09/2013 Document Revised: 03/29/2014 Document Reviewed: 03/09/2013 ExitCare Patient Information 2015 [employer]. This information is not intended to replace advice given to you by your health care provider. Make sure you discuss any questions you have with your health care provider.  

## 2014-12-16 NOTE — Addendum Note (Signed)
Addended by: Roena MaladyEVONTENNO, MELANIE Y on: 12/16/2014 04:44 PM   Modules accepted: Orders, Medications

## 2014-12-29 ENCOUNTER — Other Ambulatory Visit: Payer: Self-pay | Admitting: Internal Medicine

## 2014-12-29 NOTE — Telephone Encounter (Signed)
We talked about her not taking this every day due to the addictive properties. She must be taking twice daily. She should not be taking it that much. Refill declined

## 2014-12-29 NOTE — Telephone Encounter (Signed)
Last filled 12/14/14--next appt for 01/04/15--please advise

## 2014-12-31 ENCOUNTER — Other Ambulatory Visit: Payer: Self-pay | Admitting: Internal Medicine

## 2015-01-04 ENCOUNTER — Ambulatory Visit: Payer: PRIVATE HEALTH INSURANCE | Admitting: Internal Medicine

## 2015-01-07 ENCOUNTER — Other Ambulatory Visit: Payer: Self-pay

## 2015-01-07 DIAGNOSIS — F411 Generalized anxiety disorder: Secondary | ICD-10-CM

## 2015-01-07 MED ORDER — ALPRAZOLAM 0.5 MG PO TABS: 0.5000 mg | ORAL_TABLET | Freq: Two times a day (BID) | ORAL | Status: DC | PRN

## 2015-01-07 NOTE — Telephone Encounter (Signed)
Pt left v/m; pt last seen 12/14/14 and has dx of anxiety. Pt has appt to be seen on 01/11/15 and in meantime pt [facility] [place]. Pt is not sure if zoloft is helping. Pt last filled xanax on 12/14/14 for # 30 but pt could take twice a day if needed.Please advise.

## 2015-01-07 NOTE — Telephone Encounter (Signed)
Ok to phone in xanax early, will discuss xanax and zoloft at upcoming visit

## 2015-01-07 NOTE — Telephone Encounter (Signed)
Rx called in to pharmacy. 

## 2015-01-11 ENCOUNTER — Encounter: Payer: Self-pay | Admitting: Internal Medicine

## 2015-01-11 ENCOUNTER — Ambulatory Visit (INDEPENDENT_AMBULATORY_CARE_PROVIDER_SITE_OTHER): Payer: PRIVATE HEALTH INSURANCE | Admitting: Internal Medicine

## 2015-01-11 VITALS — BP 118/70 | HR 62 | Temp 98.2°F | Wt 177.0 lb

## 2015-01-11 DIAGNOSIS — M7072 Other bursitis of hip, left hip: Secondary | ICD-10-CM | POA: Diagnosis not present

## 2015-01-11 DIAGNOSIS — F411 Generalized anxiety disorder: Secondary | ICD-10-CM

## 2015-01-11 DIAGNOSIS — R42 Dizziness and giddiness: Secondary | ICD-10-CM

## 2015-01-11 MED ORDER — PREDNISONE 10 MG PO TABS: ORAL_TABLET | ORAL | Status: DC

## 2015-01-11 NOTE — Progress Notes (Signed)
Subjective:    Patient ID: Brandi Tate, female    DOB: 03-Dec-1965, 49 y.o.   MRN: [id number]  HPI  Pt presents to the clinic today to follow up anxiety. She was started on Zoloft and supplemental Xanax. She reports her anxiety has improved. She only takes the Xanax about twice per week. She does reports that she is having difficulty obtaining orgasm and is not sure if it is related to the medication or not. She does need a refill today.  Additionally, she reports she continues to be intermittently dizzy but it has improved significanlty. Her last episode of dizziness occurred 2 weeks ago while taking a shower. She did fall in the shower but did not hit her head or lose consciousness. She was very anxious prior to getting in the shower and thinks the anxiety is causing her syncopal episodes.  She does reports worsening left hip pain. Xray in ER 09/2014 normal. She was treated with Narcotics. Not much improvement, was given pred taper 10/05/2014. She reports she is not sure if she got any improvement with the prednisone. She is not ready to be referred to Dr. Patsy Lageropland at this time, but wants to know what her other options are.   Review of Systems      Past Medical History  Diagnosis Date  . Hypothyroid     Current Outpatient Prescriptions  Medication Sig Dispense Refill  . ALPRAZolam (XANAX) 0.5 MG tablet Take 1 tablet (0.5 mg total) by mouth 2 (two) times daily as needed for anxiety. 30 tablet 0  . ibuprofen (ADVIL,MOTRIN) 800 MG tablet Take 1 tablet (800 mg total) by mouth 3 (three) times daily. 21 tablet 0  . meclizine (ANTIVERT) 25 MG tablet Take 1 tablet (25 mg total) by mouth 3 (three) times daily as needed for dizziness. 30 tablet 0  . sertraline (ZOLOFT) 50 MG tablet Take 1 tablet (50 mg total) by mouth daily. 30 tablet 3   No current facility-administered medications for this visit.    No Known Allergies  No family history on file.  History   Social History  . Marital  Status: Married    Spouse Name: N/A  . Number of Children: N/A  . Years of Education: N/A   Occupational History  . Not on file.   Social History Main Topics  . Smoking status: Never Smoker   . Smokeless tobacco: Not on file  . Alcohol Use: 0.0 oz/week    0 Standard drinks or equivalent per week     Comment: social  . Drug Use: No  . Sexual Activity: Not on file   Other Topics Concern  . Not on file   Social History Narrative     Constitutional: Denies fever, malaise, fatigue, headache or abrupt weight changes.  Respiratory: Denies difficulty breathing, shortness of breath, cough or sputum production.   Cardiovascular: Denies chest pain, chest tightness, palpitations or swelling in the hands or feet.  Musculoskeletal: Pt reports left hip pain. Denies decrease in range of motion, difficulty with gait, muscle pain or joint swelling.  Skin: Denies redness, rashes, lesions or ulcercations.  Neurological: Pt reports dizziness. Denies difficulty with memory, difficulty with speech or problems with balance and coordination.  Psych: Pt reports anxiety. Denies depression, SI/HI.  No other specific complaints in a complete review of systems (except as listed in HPI above).  Objective:   Physical Exam BP 118/70 mmHg  Pulse 62  Temp(Src) 98.2 F (36.8 C) (Oral)  Wt 177 lb (  80.287 kg)  SpO2 98% Wt Readings from Last 3 Encounters:  01/11/15 177 lb (80.287 kg)  12/14/14 178 lb 8 oz (80.967 kg)  10/05/14 172 lb (78.019 kg)    General: Appears her stated age, well developed, well nourished in NAD. Skin: Warm, dry and intact. No rashes, lesions or ulcerations noted. HEENT: Head: normal shape and size; Eyes: sclera white, no icterus, conjunctiva pink, PERRLA and EOMs intact; Ears: Tm's gray and intact, normal light reflex;  Neck:  Neck supple, trachea midline. No masses, lumps or thyromegaly present.  Cardiovascular: Normal rate and rhythm. S1,S2 noted.  No murmur, rubs or gallops  noted.  Pulmonary/Chest: Normal effort and positive vesicular breath sounds. No respiratory distress. No wheezes, rales or ronchi noted.  Musculoskeletal: Normal internal and external rotation of left hip. Pain with internal rotation. Pain with palpation over the left trochanteric bursa. Strength 5/5 BLE. No difficulty with gait.  Neurological: Alert and oriented. Sensation intact to BLE. Psychiatric: Mood slightly anxious appearing but affect normal. Behavior is normal. Judgment and thought content normal.     BMET    Component Value Date/Time   NA 136* 10/02/2014 1429   K 4.1 10/02/2014 1429   CL 97 10/02/2014 1429   CO2 28 10/02/2014 1429   GLUCOSE 100* 10/02/2014 1429   BUN 15 10/02/2014 1429   CREATININE 0.90 10/02/2014 1429   CALCIUM 9.6 10/02/2014 1429   GFRNONAA 75* 10/02/2014 1429   GFRAA 87* 10/02/2014 1429    Lipid Panel  No results found for: CHOL, TRIG, HDL, CHOLHDL, VLDL, LDLCALC  CBC    Component Value Date/Time   WBC 5.9 10/02/2014 1429   RBC 3.96 10/02/2014 1429   HGB 12.0 10/02/2014 1429   HCT 35.6* 10/02/2014 1429   PLT 395 10/02/2014 1429   MCV 89.9 10/02/2014 1429   MCH 30.3 10/02/2014 1429   MCHC 33.7 10/02/2014 1429   RDW 12.5 10/02/2014 1429    Hgb A1C No results found for: HGBA1C        Assessment & Plan:   Left hip pain:  Likely bursitis:  Will repeat prednisone at a higher dose An ice pack to the area BID may help Once off prednisone try Aleve BID If no improvement, I strongly suggest evaluation by Dr. Patsy Lager  Dizziness secondary to Anxiety:  Improved with Zoloft Some sexual side effects, if persist over the next month, consider switching to Lexapro Xanax refilled today  RTC as needed or if symptoms persist or worsen

## 2015-01-11 NOTE — Progress Notes (Signed)
Pre visit review using our clinic review tool, if applicable. No additional management support is needed unless otherwise documented below in the visit note. 

## 2015-01-11 NOTE — Patient Instructions (Signed)
Bursitis °Bursitis is a swelling and soreness (inflammation) of a fluid-filled sac (bursa) that overlies and protects a joint. It can be caused by injury, overuse of the joint, arthritis or infection. The joints most likely to be affected are the elbows, shoulders, hips and knees. °HOME CARE INSTRUCTIONS  °· Apply ice to the affected area for 15-20 minutes each hour while awake for 2 days. Put the ice in a plastic bag and place a towel between the bag of ice and your skin. °· Rest the injured joint as much as possible, but continue to put the joint through a full range of motion, 4 times per day. (The shoulder joint especially becomes rapidly "frozen" if not used.) When the pain lessens, begin normal slow movements and usual activities. °· Only take over-the-counter or prescription medicines for pain, discomfort or fever as directed by your caregiver. °· Your caregiver may recommend draining the bursa and injecting medicine into the bursa. This may help the healing process. °· Follow all instructions for follow-up with your caregiver. This includes any orthopedic referrals, physical therapy and rehabilitation. Any delay in obtaining necessary care could result in a delay or failure of the bursitis to heal and chronic pain. °SEEK IMMEDIATE MEDICAL CARE IF:  °· Your pain increases even during treatment. °· You develop an oral temperature above 102° F (38.9° C) and have heat and inflammation over the involved bursa. °MAKE SURE YOU:  °· Understand these instructions. °· Will watch your condition. °· Will get help right away if you are not doing well or get worse. °Document Released: 11/09/2000 Document Revised: 02/04/2012 Document Reviewed: 02/01/2014 °ExitCare® Patient Information ©2015 [employer]. This information is not intended to replace advice given to you by your health care provider. Make sure you discuss any questions you have with your health care provider. ° °

## 2015-01-19 ENCOUNTER — Telehealth: Payer: Self-pay | Admitting: General Practice

## 2015-01-19 NOTE — Telephone Encounter (Signed)
Has she ever taken tramadol before?

## 2015-01-19 NOTE — Telephone Encounter (Signed)
Pt called in about her hip pain.  She is taking prednisone and it is helping a very small amount.  Pt is asking for a stronger medication.  [facility] [place]. Best number to reach patient is [phone]

## 2015-01-21 ENCOUNTER — Other Ambulatory Visit: Payer: Self-pay | Admitting: Internal Medicine

## 2015-01-21 MED ORDER — TRAMADOL HCL 50 MG PO TABS
50.0000 mg | ORAL_TABLET | Freq: Three times a day (TID) | ORAL | Status: DC | PRN
Start: 2015-01-21 — End: 2015-02-03

## 2015-01-21 NOTE — Telephone Encounter (Signed)
Rx called in to pharmacy. 

## 2015-01-21 NOTE — Telephone Encounter (Signed)
Pt states she has not tried this medication--she is willing to try tramadol and wants it called into [facility] [place]--please advise

## 2015-01-21 NOTE — Telephone Encounter (Signed)
Tramadol ordered,  Please phone in

## 2015-01-26 ENCOUNTER — Telehealth: Payer: Self-pay | Admitting: General Practice

## 2015-01-26 NOTE — Telephone Encounter (Signed)
Appointment 3/8 pt aware

## 2015-01-26 NOTE — Telephone Encounter (Signed)
Ok to make 30 min appt with me

## 2015-01-26 NOTE — Telephone Encounter (Signed)
Pt called wanting to make an appointment to be tested for lime diease she has new pt appointment 03/21/15.  [place] for vertigo.  Is it ok to make appointment?

## 2015-02-01 ENCOUNTER — Ambulatory Visit (INDEPENDENT_AMBULATORY_CARE_PROVIDER_SITE_OTHER): Payer: 59 | Admitting: Internal Medicine

## 2015-02-01 ENCOUNTER — Encounter: Payer: Self-pay | Admitting: Internal Medicine

## 2015-02-01 VITALS — BP 98/70 | HR 85 | Temp 98.5°F | Wt 173.0 lb

## 2015-02-01 DIAGNOSIS — M791 Myalgia, unspecified site: Secondary | ICD-10-CM

## 2015-02-01 DIAGNOSIS — M25552 Pain in left hip: Secondary | ICD-10-CM

## 2015-02-01 DIAGNOSIS — R5383 Other fatigue: Secondary | ICD-10-CM

## 2015-02-01 DIAGNOSIS — M2550 Pain in unspecified joint: Secondary | ICD-10-CM | POA: Diagnosis not present

## 2015-02-01 DIAGNOSIS — M2560 Stiffness of unspecified joint, not elsewhere classified: Secondary | ICD-10-CM

## 2015-02-01 DIAGNOSIS — M25551 Pain in right hip: Secondary | ICD-10-CM

## 2015-02-01 DIAGNOSIS — F411 Generalized anxiety disorder: Secondary | ICD-10-CM

## 2015-02-01 LAB — GAMMA GT: GGT: 13 U/L (ref 7–51)

## 2015-02-01 LAB — C-REACTIVE PROTEIN: CRP: 9.5 mg/dL (ref 0.5–20.0)

## 2015-02-01 LAB — SEDIMENTATION RATE: Sed Rate: 54 mm/hr — ABNORMAL HIGH (ref 0–22)

## 2015-02-01 MED ORDER — ALPRAZOLAM 0.5 MG PO TABS: 0.5000 mg | ORAL_TABLET | Freq: Two times a day (BID) | ORAL | Status: DC | PRN

## 2015-02-01 NOTE — Patient Instructions (Signed)

## 2015-02-01 NOTE — Progress Notes (Signed)
Pre visit review using our clinic review tool, if applicable. No additional management support is needed unless otherwise documented below in the visit note. 

## 2015-02-01 NOTE — Progress Notes (Signed)
Subjective:    Patient ID: Brandi Tate, female    DOB: Oct 14, 1966, 49 y.o.   MRN: [id number]  HPI  Pt presents to the clinic today to be tested for lyme's disease. She reports she has been having muscle aches, fatigue, and multiple joint pains. This has been going over the last few months. She reports she remembers a tick bite last summer and really believes her symptoms are related. She does not remember a rash. She has not felt well since the fall.  Additionally, her hip pain has not improved with Tramadol. She has been seen for the same 10/05/2014 and 01/11/15. She had a normal xray 09/2014. She had a round of prednisone without any relief. She denies any injury. She describes the pain as an intense ache. Laying down and sitting make it worse. Nothing has made it better.  She would like her Xanax refilled. She only takes it as needed. The Zoloft has helped her anxiety tremendously.  Review of Systems  Past Medical History  Diagnosis Date  . Hypothyroid     Current Outpatient Prescriptions  Medication Sig Dispense Refill  . ALPRAZolam (XANAX) 0.5 MG tablet Take 1 tablet (0.5 mg total) by mouth 2 (two) times daily as needed for anxiety. 30 tablet 0  . ibuprofen (ADVIL,MOTRIN) 800 MG tablet Take 1 tablet (800 mg total) by mouth 3 (three) times daily. 21 tablet 0  . sertraline (ZOLOFT) 50 MG tablet Take 1 tablet (50 mg total) by mouth daily. 30 tablet 3  . traMADol (ULTRAM) 50 MG tablet Take 1 tablet (50 mg total) by mouth every 8 (eight) hours as needed. 30 tablet 0   No current facility-administered medications for this visit.    No Known Allergies  History reviewed. No pertinent family history.  History   Social History  . Marital Status: Married    Spouse Name: N/A  . Number of Children: N/A  . Years of Education: N/A   Occupational History  . Not on file.   Social History Main Topics  . Smoking status: Never Smoker   . Smokeless tobacco: Not on file  . Alcohol  Use: 0.0 oz/week    0 Standard drinks or equivalent per week     Comment: social  . Drug Use: No  . Sexual Activity: Not on file   Other Topics Concern  . Not on file   Social History Narrative     Constitutional: Pt reports fatigue. Denies fever, headache or abrupt weight changes.  Respiratory: Denies difficulty breathing, shortness of breath, cough or sputum production.   Cardiovascular: Denies chest pain, chest tightness, palpitations or swelling in the hands or feet.  Gastrointestinal: Denies abdominal pain, bloating, constipation, diarrhea or blood in the stool.  Musculoskeletal: Pt reports hip pain. Denies decrease in range of motion, difficulty with gait, muscle pain or joint swelling.  Skin: Denies redness, rashes, lesions or ulcercations.  Neurological: Denies dizziness, difficulty with memory, difficulty with speech or problems with balance and coordination.  Psych: Pt reports anxiety. Denies depression, SI/HI.  No other specific complaints in a complete review of systems (except as listed in HPI above).     Objective:   Physical Exam  BP 98/70 mmHg  Pulse 85  Temp(Src) 98.5 F (36.9 C) (Oral)  Wt 173 lb (78.472 kg)  SpO2 99%  LMP 01/10/2015 Wt Readings from Last 3 Encounters:  02/01/15 173 lb (78.472 kg)  01/11/15 177 lb (80.287 kg)  12/14/14 178 lb 8 oz (80.967 kg)  General: Appears her stated age, well developed, well nourished in NAD. Skin: Warm, dry and intact. No rashes, lesions or ulcerations noted. Cardiovascular: Normal rate and rhythm. S1,S2 noted.  No murmur, rubs or gallops noted. Pulmonary/Chest: Normal effort and positive vesicular breath sounds. No respiratory distress. No wheezes, rales or ronchi noted.  Musculoskeletal: Normal range of motion. No signs of joint swelling. No difficulty with gait.  Neurological: Alert and oriented.  Coordination normal.  Psychiatric: Mood anxious but affect normal. Behavior is normal. Judgment and thought  content normal.     BMET    Component Value Date/Time   NA 136* 10/02/2014 1429   K 4.1 10/02/2014 1429   CL 97 10/02/2014 1429   CO2 28 10/02/2014 1429   GLUCOSE 100* 10/02/2014 1429   BUN 15 10/02/2014 1429   CREATININE 0.90 10/02/2014 1429   CALCIUM 9.6 10/02/2014 1429   GFRNONAA 75* 10/02/2014 1429   GFRAA 87* 10/02/2014 1429    Lipid Panel  No results found for: CHOL, TRIG, HDL, CHOLHDL, VLDL, LDLCALC  CBC    Component Value Date/Time   WBC 5.9 10/02/2014 1429   RBC 3.96 10/02/2014 1429   HGB 12.0 10/02/2014 1429   HCT 35.6* 10/02/2014 1429   PLT 395 10/02/2014 1429   MCV 89.9 10/02/2014 1429   MCH 30.3 10/02/2014 1429   MCHC 33.7 10/02/2014 1429   RDW 12.5 10/02/2014 1429    Hgb A1C No results found for: HGBA1C       Assessment & Plan:   Fatigue, muscle aches, joint pains:  Exam is normal Will order Burgdorferi antibody, ESR, CRP,GGT, ANA and RF If labs normal, consider referral to rheumatology for further evaluation  Bilateral hip pain:  Xrays reviewed Offered to repeat xrays but she declines at this time I advised her again to make an appt with Dr. Lorelei Pont but she would like to hold off at this time  Anxiety:  Continue Zoloft at current dose Xanax RX given today Will follow UDS  Will follow up with you after the xrays, RTC as needed

## 2015-02-02 LAB — B. BURGDORFI ANTIBODIES: B burgdorferi Ab IgG+IgM: 0.49 {ISR}

## 2015-02-02 LAB — ANA: Anti Nuclear Antibody(ANA): NEGATIVE

## 2015-02-02 LAB — RHEUMATOID FACTOR: RHEUMATOID FACTOR: 11 [IU]/mL (ref <=14)

## 2015-02-03 ENCOUNTER — Other Ambulatory Visit: Payer: Self-pay | Admitting: Internal Medicine

## 2015-02-03 DIAGNOSIS — M2550 Pain in unspecified joint: Secondary | ICD-10-CM

## 2015-02-03 DIAGNOSIS — R700 Elevated erythrocyte sedimentation rate: Secondary | ICD-10-CM

## 2015-02-03 DIAGNOSIS — R5383 Other fatigue: Secondary | ICD-10-CM

## 2015-02-03 NOTE — Telephone Encounter (Signed)
Pt left v/m requesting refill tramadol to [facility]; pt had said she did not think tramadol was helping but since stopping tramadol she thinks it was helping her bursitis.pt request cb when refilled.

## 2015-02-03 NOTE — Telephone Encounter (Signed)
She told me yesterday the tramadol was like a sugar pill. If not effective, she should stick with Ibuprofen and Tylenol OTC

## 2015-02-03 NOTE — Telephone Encounter (Signed)
Last filled 01/21/15--please advise

## 2015-02-03 NOTE — Telephone Encounter (Signed)
Rx called in to pharmacy. 

## 2015-02-03 NOTE — Telephone Encounter (Signed)
Ok to phone in tramadol 

## 2015-02-11 ENCOUNTER — Other Ambulatory Visit: Payer: Self-pay | Admitting: Internal Medicine

## 2015-02-11 NOTE — Telephone Encounter (Signed)
This was just filled 02/01/15--please advise if it is too soon for refill

## 2015-02-16 ENCOUNTER — Other Ambulatory Visit: Payer: Self-pay

## 2015-02-16 DIAGNOSIS — F411 Generalized anxiety disorder: Secondary | ICD-10-CM

## 2015-02-16 NOTE — Telephone Encounter (Signed)
She can not have the xanax refilled until 4/8. The zoloft should be controlling her anxiety, if not we should increase. The xanax is only for prn use for severe anxiety
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
## 2015-02-23 NOTE — Telephone Encounter (Signed)
Pt called to ck on status of xanax refill; advised pt as instructed from 02/16/15 phone note. Pt voiced understanding and said zoloft is helping and pt will cb for xanax refill in 02/2015. If condition changes or worsens pt will cb.

## 2015-02-25 ENCOUNTER — Telehealth: Payer: Self-pay | Admitting: *Deleted

## 2015-02-25 NOTE — Telephone Encounter (Addendum)
Pt called stating that she spoke with a nurse yesterday about getting her xanax rx. She was told that it would have to wait until 03/04/15. She said she has been having a lot of anxiety these past few week from dealing with dizziness, hip/ leg pain, and muscle aches. She has appt with rhuematologist on 03/09/15. Please advise?

## 2015-02-25 NOTE — Telephone Encounter (Signed)
This needs to go through PCP. Noted previous denial.

## 2015-02-26 NOTE — Telephone Encounter (Signed)
This is as needed and should not be taking daily. As I mentioned earlier, she may want to go up on her zoloft. Will not refill early.

## 2015-03-04 ENCOUNTER — Telehealth: Payer: Self-pay | Admitting: Internal Medicine

## 2015-03-04 MED ORDER — ALPRAZOLAM 0.5 MG PO TABS: 0.5000 mg | ORAL_TABLET | Freq: Two times a day (BID) | ORAL | Status: DC | PRN

## 2015-03-04 NOTE — Addendum Note (Signed)
Addended by: Roena MaladyEVONTENNO, Shriley Joffe Y on: 03/04/2015 12:43 PM   Modules accepted: Orders

## 2015-03-04 NOTE — Telephone Encounter (Signed)
Rx called into pharmacy Per Brandi Tate previous msg stating to not be filled until on or after 03/04/2015

## 2015-03-21 ENCOUNTER — Ambulatory Visit (INDEPENDENT_AMBULATORY_CARE_PROVIDER_SITE_OTHER): Payer: PRIVATE HEALTH INSURANCE | Admitting: Internal Medicine

## 2015-03-21 ENCOUNTER — Encounter: Payer: Self-pay | Admitting: Internal Medicine

## 2015-03-21 VITALS — BP 106/78 | HR 72 | Temp 98.3°F | Ht 67.0 in | Wt 174.0 lb

## 2015-03-21 DIAGNOSIS — G5601 Carpal tunnel syndrome, right upper limb: Secondary | ICD-10-CM

## 2015-03-21 DIAGNOSIS — M7071 Other bursitis of hip, right hip: Secondary | ICD-10-CM

## 2015-03-21 DIAGNOSIS — M7072 Other bursitis of hip, left hip: Secondary | ICD-10-CM

## 2015-03-21 DIAGNOSIS — F419 Anxiety disorder, unspecified: Principal | ICD-10-CM

## 2015-03-21 DIAGNOSIS — F329 Major depressive disorder, single episode, unspecified: Secondary | ICD-10-CM | POA: Insufficient documentation

## 2015-03-21 DIAGNOSIS — F418 Other specified anxiety disorders: Secondary | ICD-10-CM

## 2015-03-21 DIAGNOSIS — E039 Hypothyroidism, unspecified: Secondary | ICD-10-CM

## 2015-03-21 NOTE — Patient Instructions (Signed)
Carpal Tunnel Syndrome The carpal tunnel is a narrow area located on the palm side of your wrist. The tunnel is formed by the wrist bones and ligaments. Nerves, blood vessels, and tendons pass through the carpal tunnel. Repeated wrist motion or certain diseases may cause swelling within the tunnel. This swelling pinches the main nerve in the wrist (median nerve) and causes the painful hand and arm condition called carpal tunnel syndrome. CAUSES   Repeated wrist motions.  Wrist injuries.  Certain diseases like arthritis, diabetes, alcoholism, hyperthyroidism, and kidney failure.  Obesity.  Pregnancy. SYMPTOMS   A "pins and needles" feeling in your fingers or hand, especially in your thumb, index and middle fingers.  Tingling or numbness in your fingers or hand.  An aching feeling in your entire arm, especially when your wrist and elbow are bent for long periods of time.  Wrist pain that goes up your arm to your shoulder.  Pain that goes down into your palm or fingers.  A weak feeling in your hands. DIAGNOSIS  Your health care provider will take your history and perform a physical exam. An electromyography test may be needed. This test measures electrical signals sent out by your nerves into the muscles. The electrical signals are usually slowed by carpal tunnel syndrome. You may also need X-rays. TREATMENT  Carpal tunnel syndrome may clear up by itself. Your health care provider may recommend a wrist splint or medicine such as a nonsteroidal anti-inflammatory medicine. Cortisone injections may help. Sometimes, surgery may be needed to free the pinched nerve.  HOME CARE INSTRUCTIONS   Take all medicine as directed by your health care provider. Only take over-the-counter or prescription medicines for pain, discomfort, or fever as directed by your health care provider.  If you were given a splint to keep your wrist from bending, wear it as directed. It is important to wear the splint at  night. Wear the splint for as long as you have pain or numbness in your hand, arm, or wrist. This may take 1 to 2 months.  Rest your wrist from any activity that may be causing your pain. If your symptoms are work-related, you may need to talk to your employer about changing to a job that does not require using your wrist.  Put ice on your wrist after long periods of wrist activity.  Put ice in a plastic bag.  Place a towel between your skin and the bag.  Leave the ice on for 15-20 minutes, 03-04 times a day.  Keep all follow-up visits as directed by your health care provider. This includes any orthopedic referrals, physical therapy, and rehabilitation. Any delay in getting necessary care could result in a delay or failure of your condition to heal. SEEK IMMEDIATE MEDICAL CARE IF:   You have new, unexplained symptoms.  Your symptoms get worse and are not helped or controlled with medicines. MAKE SURE YOU:   Understand these instructions.  Will watch your condition.  Will get help right away if you are not doing well or get worse. Document Released: 11/09/2000 Document Revised: 03/29/2014 Document Reviewed: 09/28/2011 ExitCare Patient Information 2015 [employer]. This information is not intended to replace advice given to you by your health care provider. Make sure you discuss any questions you have with your health care provider.  

## 2015-03-21 NOTE — Assessment & Plan Note (Signed)
Chronic but stable Will continue Zoloft and Xanax for now

## 2015-03-21 NOTE — Progress Notes (Signed)
Pre visit review using our clinic review tool, if applicable. No additional management support is needed unless otherwise documented below in the visit note. 

## 2015-03-21 NOTE — Assessment & Plan Note (Signed)
Asymptomatic Labs are all normal Will not restart medications at this time Will continue to monitor

## 2015-03-21 NOTE — Progress Notes (Signed)
HPI  Pt presents to the clinic today to establish care and for management of the conditions listed below. She has not had a PCP in many years.  Flu: never Tetanus: > 10 years ago Pap Smear: ? 2012 Mammogram: 2000 Colon Screening: 2008 Vision Screening: > 1 year ago Dentist: prn   Depression: Chronic but stable. She takes Zoloft daily. She takes the Xanax daily at night, occasionally in the morning too. She does report that things are calming down for her, she is getting adjusted in her job, her children will be coming to visit soon.   Hypothyroid: Her levels back in 09/2014 were normal, even though she had been off her meds for 2 months prior. She was not restarted on Synthroid. She denies weight gain, abnormally cold sensation, excessively dry skin, constipation, etc.  She is having this ongoing issue with pain in her right wrist, hip pain and other generalized joint pains. She did have an elevated ESR and was referred to rheumatology. They did a sonogram and xray of her right wrist, and thought that it may be carpal tunnel, but has not heard any definitive answer about her test results. They also told her she has bursitis of her hips and prescribed Meloxicam for her, but she is not taking it. She takes Advil as needed. She reports she has not followup appt scheduled.  Past Medical History  Diagnosis Date  . Hypothyroid   . Chicken pox   . Depression   . History of fainting spells of unknown cause     Current Outpatient Prescriptions  Medication Sig Dispense Refill  . ALPRAZolam (XANAX) 0.5 MG tablet Take 1 tablet (0.5 mg total) by mouth 2 (two) times daily as needed for anxiety. 30 tablet 0  . ibuprofen (ADVIL,MOTRIN) 800 MG tablet Take 1 tablet (800 mg total) by mouth 3 (three) times daily. 21 tablet 0  . sertraline (ZOLOFT) 50 MG tablet Take 1 tablet (50 mg total) by mouth daily. 30 tablet 3  . traMADol (ULTRAM) 50 MG tablet TAKE 1 TABLET BY MOUTH 3 TIMES A DAY AS NEEDED 30 tablet  0   No current facility-administered medications for this visit.    No Known Allergies  History reviewed. No pertinent family history.  History   Social History  . Marital Status: Married    Spouse Name: N/A  . Number of Children: N/A  . Years of Education: N/A   Occupational History  . Not on file.   Social History Main Topics  . Smoking status: Never Smoker   . Smokeless tobacco: Not on file  . Alcohol Use: 0.0 oz/week    0 Standard drinks or equivalent per week     Comment: social  . Drug Use: No  . Sexual Activity: Not on file   Other Topics Concern  . Not on file   Social History Narrative    ROS:  Constitutional: Denies fever, malaise, fatigue, headache or abrupt weight changes.  HEENT: Denies eye pain, eye redness, ear pain, ringing in the ears, wax buildup, runny nose, nasal congestion, bloody nose, or sore throat. Respiratory: Denies difficulty breathing, shortness of breath, cough or sputum production.   Cardiovascular: Denies chest pain, chest tightness, palpitations or swelling in the hands or feet.  Gastrointestinal: Denies abdominal pain, bloating, constipation, diarrhea or blood in the stool.  GU: Denies frequency, urgency, pain with urination, blood in urine, odor or discharge. Musculoskeletal: Pt reports joint pains. Denies decrease in range of motion, difficulty with gait,  muscle pain or joint swelling.  Skin: Denies redness, rashes, lesions or ulcercations.  Neurological: Denies dizziness, difficulty with memory, difficulty with speech or problems with balance and coordination.  Psych: Pt reports anxiety and depression. Denies SI/HI.  No other specific complaints in a complete review of systems (except as listed in HPI above).  PE:  BP 106/78 mmHg  Pulse 72  Temp(Src) 98.3 F (36.8 C) (Oral)  Ht _0  (1.702 m)  Wt 174 lb (78.926 kg)  BMI 27.25 kg/m2  SpO2 99%  LMP 03/09/2015 Wt Readings from Last 3 Encounters:  03/21/15 174 lb (78.926  kg)  02/01/15 173 lb (78.472 kg)  01/11/15 177 lb (80.287 kg)    General: Appears her stated age, well developed, well nourished in NAD. HEENT: Head: normal shape and size; Eyes: sclera white, no icterus, conjunctiva pink, PERRLA and EOMs intact. Neck:  Neck supple, trachea midline. No masses, lumps or thyromegaly present.  Cardiovascular: Normal rate and rhythm. S1,S2 noted.  No murmur, rubs or gallops noted.  Pulmonary/Chest: Normal effort and positive vesicular breath sounds. No respiratory distress. No wheezes, rales or ronchi noted.  Musculoskeletal:  No signs of joint swelling. No difficulty with gait.  Neurological: Alert and oriented.  Psychiatric: Mood and affect somewhat flat. Behavior is normal. Judgment and thought content normal.     BMET    Component Value Date/Time   NA 136* 10/02/2014 1429   K 4.1 10/02/2014 1429   CL 97 10/02/2014 1429   CO2 28 10/02/2014 1429   GLUCOSE 100* 10/02/2014 1429   BUN 15 10/02/2014 1429   CREATININE 0.90 10/02/2014 1429   CALCIUM 9.6 10/02/2014 1429   GFRNONAA 75* 10/02/2014 1429   GFRAA 87* 10/02/2014 1429    Lipid Panel  No results found for: CHOL, TRIG, HDL, CHOLHDL, VLDL, LDLCALC  CBC    Component Value Date/Time   WBC 5.9 10/02/2014 1429   RBC 3.96 10/02/2014 1429   HGB 12.0 10/02/2014 1429   HCT 35.6* 10/02/2014 1429   PLT 395 10/02/2014 1429   MCV 89.9 10/02/2014 1429   MCH 30.3 10/02/2014 1429   MCHC 33.7 10/02/2014 1429   RDW 12.5 10/02/2014 1429    Hgb A1C No results found for: HGBA1C   Assessment and Plan:  Health Maintenance:  She is past due on many things Will have her schedule an appt for a physical exam  Multiple joint pain:  She will follow up with rheum prn If this is carpal tunnel of right wrist, I can refer her to a hand surgeon if rheumatology does not Advised her to take consider taking the meloxicam daily to help with her bilateral hip bursitis  Make an appt for your annual exam

## 2015-03-25 ENCOUNTER — Other Ambulatory Visit: Payer: Self-pay | Admitting: Internal Medicine

## 2015-03-25 NOTE — Telephone Encounter (Signed)
Last filled 03/04/2015--please advise

## 2015-03-25 NOTE — Telephone Encounter (Signed)
Patient need a refll of Xanax

## 2015-03-25 NOTE — Telephone Encounter (Signed)
Rx called in to pharmacy. 

## 2015-03-25 NOTE — Telephone Encounter (Signed)
Ok to phone in xanax now, I increased her quantity to 45 because she takes 2, it is written as BID prn

## 2015-03-28 ENCOUNTER — Other Ambulatory Visit: Payer: Self-pay | Admitting: Internal Medicine

## 2015-03-28 NOTE — Telephone Encounter (Signed)
Called pharmacy and pharmacist stated the Rx had been ready since Friday after I called it in--pt is aware that Rx is ready for pick up

## 2015-04-23 ENCOUNTER — Other Ambulatory Visit: Payer: Self-pay | Admitting: Internal Medicine

## 2015-04-25 ENCOUNTER — Other Ambulatory Visit: Payer: Self-pay | Admitting: Internal Medicine

## 2015-04-26 NOTE — Telephone Encounter (Signed)
Ok to phone in xanax 

## 2015-04-26 NOTE — Telephone Encounter (Signed)
Last filled 03/25/2015--please advise 

## 2015-04-26 NOTE — Telephone Encounter (Signed)
Rx called in to pharmacy. 

## 2015-04-26 NOTE — Telephone Encounter (Signed)
Pt left v/m requesting status of xanax and zoloft refills. Pt request cb.

## 2015-05-16 ENCOUNTER — Encounter: Payer: PRIVATE HEALTH INSURANCE | Admitting: Internal Medicine

## 2015-05-22 ENCOUNTER — Other Ambulatory Visit: Payer: Self-pay | Admitting: Internal Medicine

## 2015-05-23 NOTE — Telephone Encounter (Signed)
Last refilled on 02/24/15 #45 with 0 refills, please advise

## 2015-05-23 NOTE — Telephone Encounter (Signed)
Please phone in Xanax.

## 2015-05-23 NOTE — Telephone Encounter (Signed)
Rx called in as prescribed 

## 2015-06-24 ENCOUNTER — Other Ambulatory Visit: Payer: Self-pay | Admitting: Internal Medicine

## 2015-06-24 NOTE — Telephone Encounter (Signed)
Last filled 05/23/15--pt needs UDS--please advise

## 2015-06-24 NOTE — Telephone Encounter (Signed)
Rx called in to pharmacy. 

## 2015-06-24 NOTE — Telephone Encounter (Signed)
Ok to phone is Xanax

## 2015-07-06 ENCOUNTER — Telehealth: Payer: Self-pay | Admitting: Internal Medicine

## 2015-07-06 MED ORDER — SERTRALINE HCL 50 MG PO TABS: 50.0000 mg | ORAL_TABLET | Freq: Every day | ORAL | Status: AC

## 2015-07-06 MED ORDER — ALPRAZOLAM 0.5 MG PO TABS: 0.5000 mg | ORAL_TABLET | Freq: Two times a day (BID) | ORAL | Status: DC | PRN

## 2015-07-06 NOTE — Telephone Encounter (Signed)
ok 

## 2015-07-06 NOTE — Telephone Encounter (Signed)
Pt is going out of town Friday and will be back in the middle of September and pt is requesting a paper rx of zoloft and xanax.   cb number [phone]

## 2015-07-06 NOTE — Telephone Encounter (Signed)
Rx printed and lmovm---Rx left in front office for pick up

## 2015-07-06 NOTE — Addendum Note (Signed)
Addended by: Roena Malady on: 07/06/2015 01:42 PM   Modules accepted: Orders

## 2015-07-07 ENCOUNTER — Encounter: Payer: Self-pay | Admitting: Internal Medicine

## 2015-08-11 ENCOUNTER — Other Ambulatory Visit: Payer: Self-pay

## 2015-08-11 MED ORDER — ALPRAZOLAM 0.5 MG PO TABS: 0.5000 mg | ORAL_TABLET | Freq: Two times a day (BID) | ORAL | Status: DC | PRN

## 2015-08-11 NOTE — Telephone Encounter (Signed)
Ok to phone in xanax 

## 2015-08-11 NOTE — Telephone Encounter (Signed)
Pt left v/m; pt is out of town longer than she anticipated and request refills of alprazolam(last refilled # 45 on 07/06/15) and zoloft(has available refills at [facility] for transfer) to [facility] in [place], [place]  Phone # [phone]. Pt established care 03/21/15. Left v/m requesting pt to cb. Pt has available refills of zoloft for transfer and spoke with [facility] pharmacy and they will accept call [place]an[place].

## 2015-08-11 NOTE — Telephone Encounter (Signed)
Pt called back and will get zoloft transferred and will be out of state for at least another month. Pt has few xanax left. And request xanax called to [facility] in [place].Phone # [phone].

## 2015-08-12 NOTE — Telephone Encounter (Signed)
Go ahead and phone in this month. Will get a repeat UDS before next refill.

## 2015-08-12 NOTE — Telephone Encounter (Signed)
Pt had UDS and was positive for Amphetamines--pt states she does not know how that could be in her system--she states her son takes Vyvanse and Maybe she accidentally took his meds one time. Pt denies any other meds than Xanax, Zoloft and Celexa---explained to pt there was a high concentration of medication---pt denies street or recreational drug use---please advise if okay to refill any way--I have UDS

## 2015-08-15 NOTE — Telephone Encounter (Signed)
Rx called into pharmacy [facility] in [place]

## 2015-09-15 ENCOUNTER — Encounter: Payer: Self-pay | Admitting: Internal Medicine

## 2015-09-19 ENCOUNTER — Other Ambulatory Visit: Payer: Self-pay

## 2015-09-19 ENCOUNTER — Other Ambulatory Visit: Payer: Self-pay | Admitting: Internal Medicine

## 2015-09-19 MED ORDER — ALPRAZOLAM 0.5 MG PO TABS: 0.5000 mg | ORAL_TABLET | Freq: Two times a day (BID) | ORAL | Status: AC | PRN

## 2015-09-19 NOTE — Telephone Encounter (Signed)
Last filled 08/10/2015--please advise

## 2015-09-19 NOTE — Telephone Encounter (Signed)
Ok to phone in Xanax 

## 2015-09-19 NOTE — Telephone Encounter (Signed)
Rx called in to pharmacy. 

## 2016-01-17 IMAGING — MR MR HEAD W/O CM
9 of 10 series · 31 of 48 positions shown · non-contrast
Comparison: None.

CLINICAL DATA: Dizziness. Hypothyroidism. Nausea and vomiting.
Generalized body aches.

EXAM:
MRI HEAD WITHOUT CONTRAST
TECHNIQUE: Multiplanar, multiecho pulse sequences of the brain and surrounding
structures were obtained without intravenous contrast.

[Series 2: FLAIR · sagittal · 5.0mm · 0.47mm/px · 2 of 23 slices shown (1 of 2)]
[im 1/23]
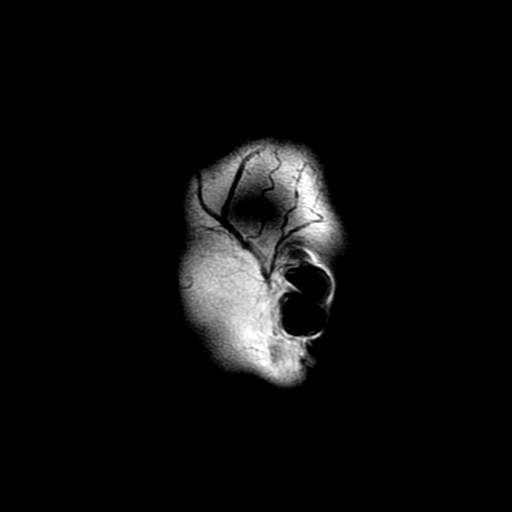
[im 23/23]
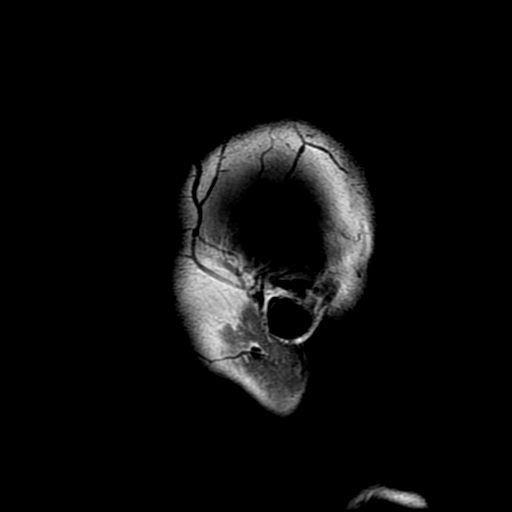

[Series 4: DWI · axial · 5.0mm · 1.02mm/px · z∈[-29,+119]mm · 5 of 62 slices shown (1 of 4)]
[im 1/62]
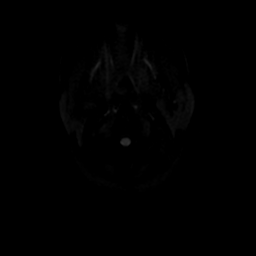
[im 16/62]
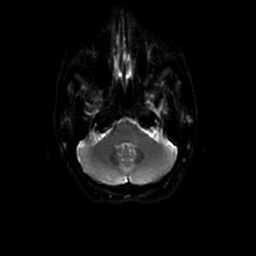
[im 31/62]
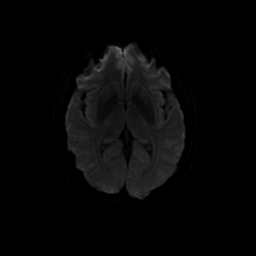
[im 46/62]
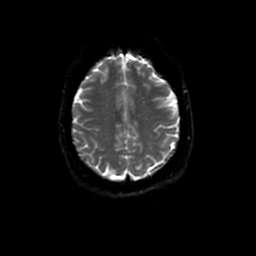
[im 62/62]
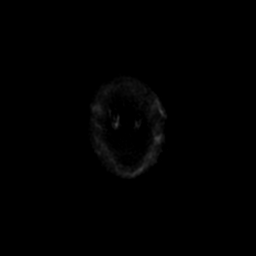

[Series 6: T2 · axial · 5.0mm · 0.43mm/px · z∈[-32,+122]mm · 2 of 27 slices shown]
[im 1/27]
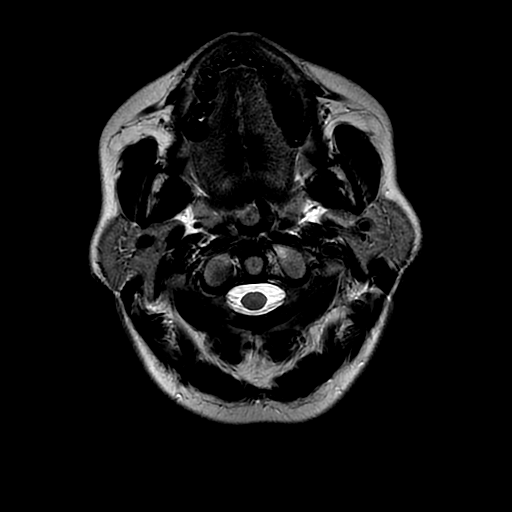
[im 27/27]
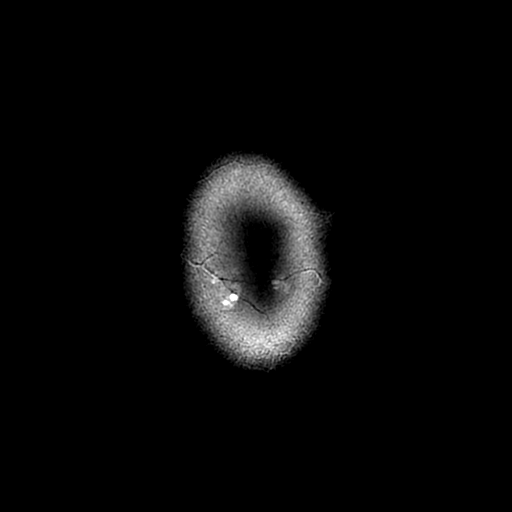

[Series 7: FLAIR · axial · 5.0mm · 0.43mm/px · z∈[-32,+122]mm · 2 of 27 slices shown (2 of 2)]
[im 1/27]
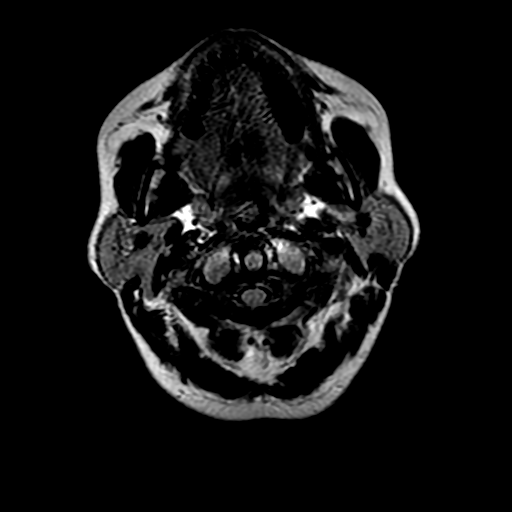
[im 27/27]
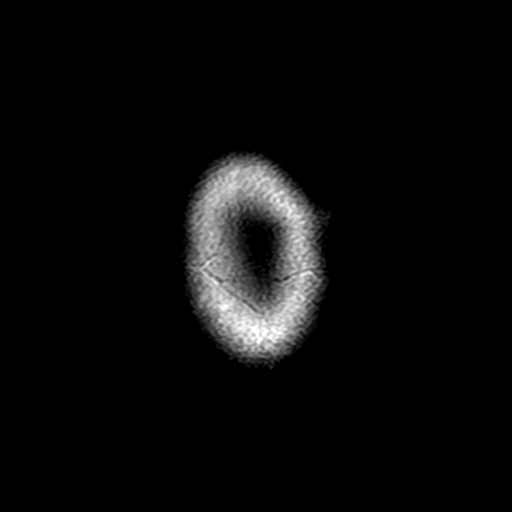

[Series 8: DWI · coronal · 5.0mm · 1.02mm/px · 6 of 68 slices shown (2 of 4)]
[im 1/68]
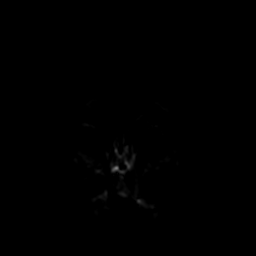
[im 14/68]
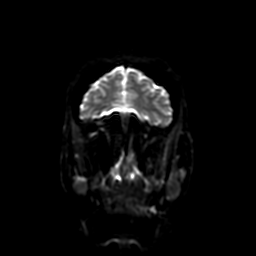
[im 27/68]
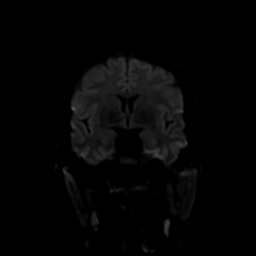
[im 41/68]
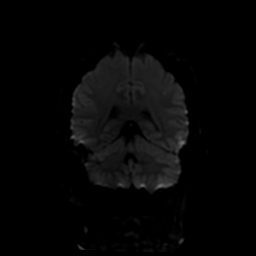
[im 54/68]
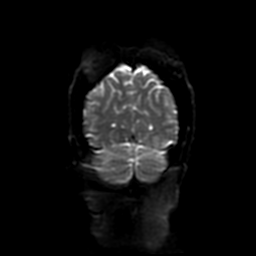
[im 68/68]
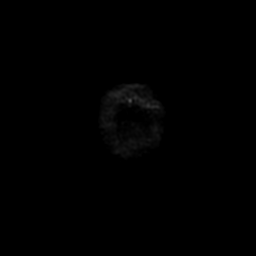

[Series 9: (person_name) · axial · 3.6mm · 0.47mm/px · z∈[-28,+56]mm · 5 of 168 slices shown]
[im 1/168]
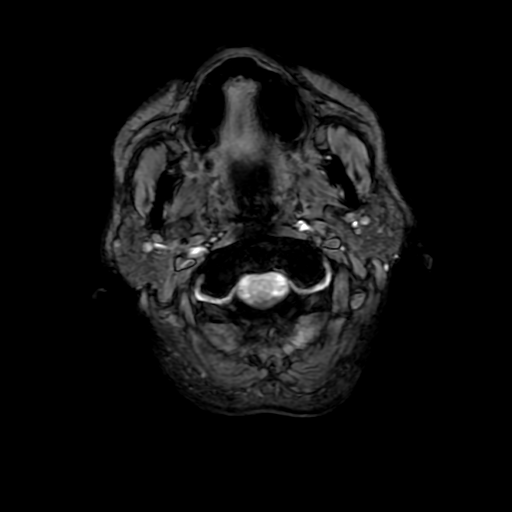
[im 24/168]
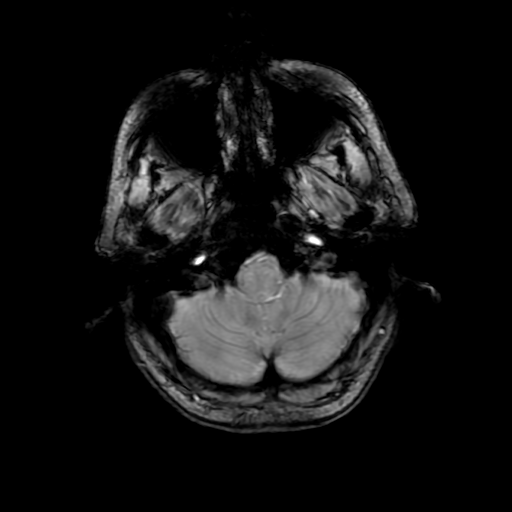
[im 48/168]
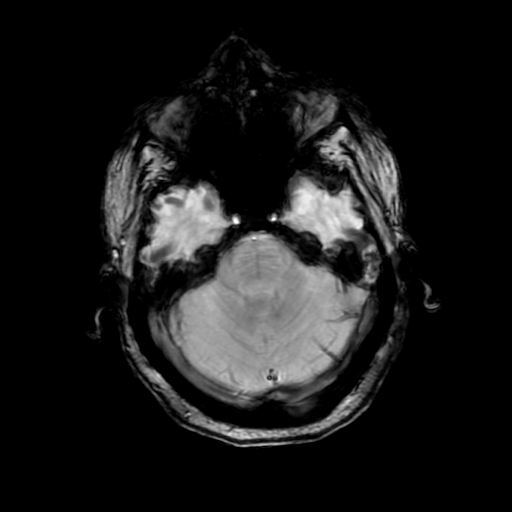
[im 72/168]
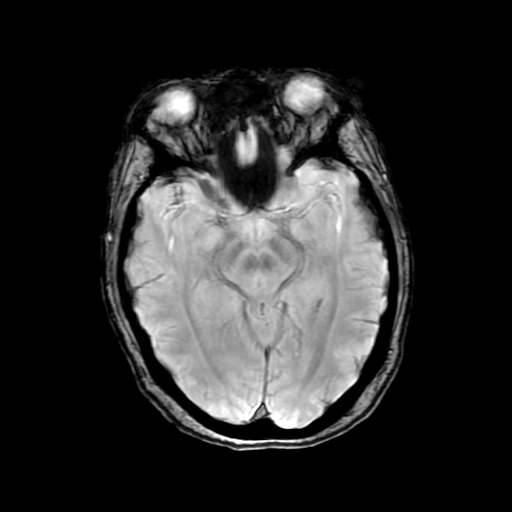
[im 96/168]
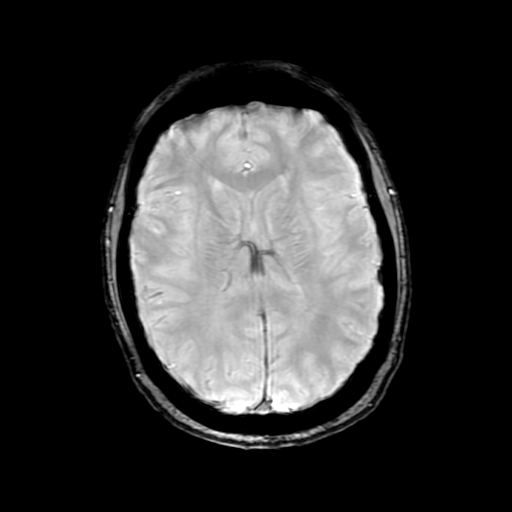

[Series 11: T2 post-contrast · coronal · 5.0mm · 0.47mm/px · 3 of 29 slices shown]
[im 1/29]
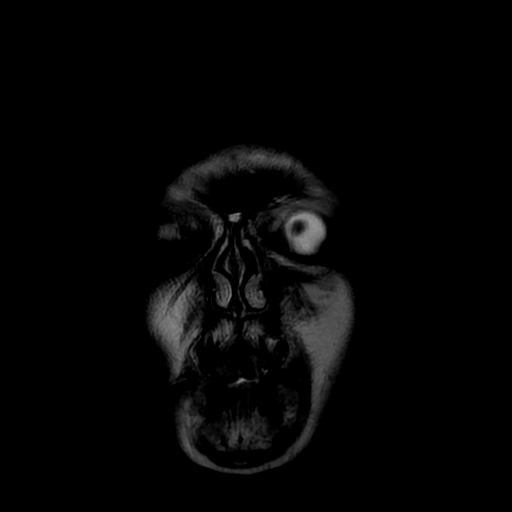
[im 15/29]
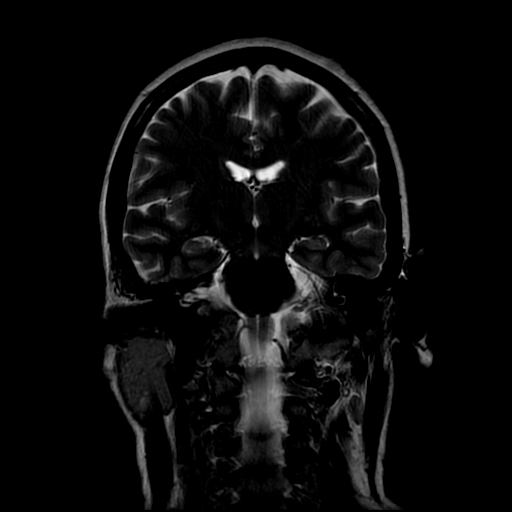
[im 29/29]
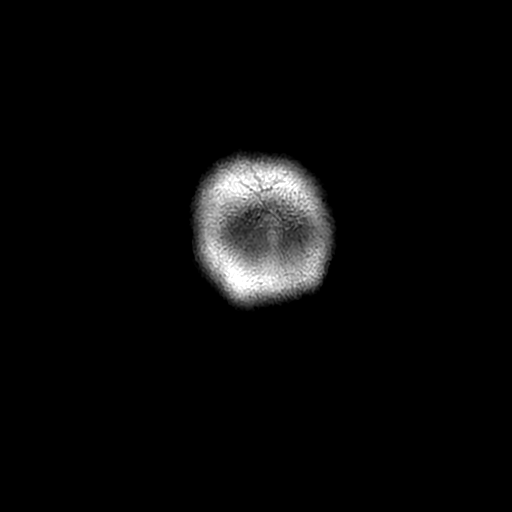

[Series 400: DWI · axial · 5.0mm · 1.02mm/px · z∈[-29,+119]mm · 3 of 31 slices shown (3 of 4)]
[im 1/31]
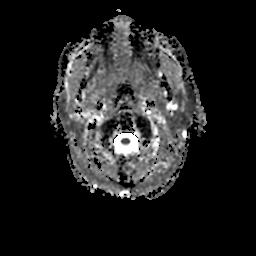
[im 16/31]
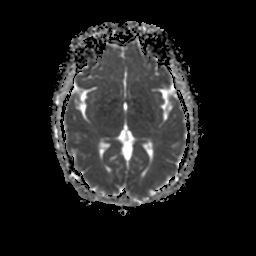
[im 31/31]
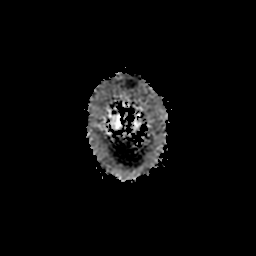

[Series 800: DWI · coronal · 5.0mm · 1.02mm/px · 3 of 34 slices shown (4 of 4)]
[im 1/34]
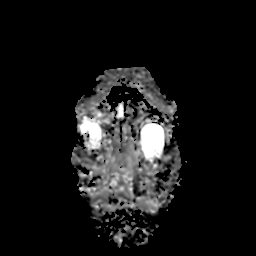
[im 17/34]
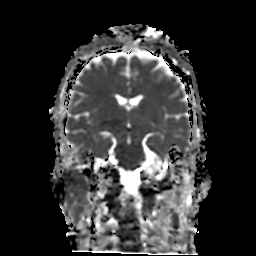
[im 34/34]
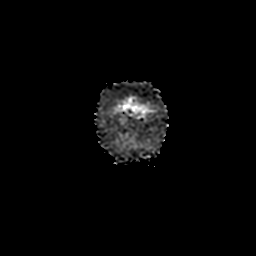

[31 of 48 positions shown; findings below may reference images not displayed]

FINDINGS: No acute infarct, hemorrhage, or mass lesion is present. The
ventricles are of normal size. No significant extraaxial fluid
collection is present. No significant white matter disease is
present.

Flow is present in the major intracranial arteries. The globes and
orbits are intact. Midline structures are within normal limits. Mild
mucosal thickening is present in the anterior ethmoid air cells and
inferior right frontal sinus. Remaining paranasal sinuses and the
mastoid air cells are clear.
IMPRESSION: 1. Normal MRI appearance of the brain.
2. Minimal anterior paranasal sinus disease.
